# Patient Record
Sex: Female | Born: 1965 | Race: Black or African American | Hispanic: No | Marital: Married | Smoking: Never smoker
Health system: Southern US, Community
[De-identification: ages and names within clinical notes are randomized; demographics above are authoritative.]

## PROBLEM LIST (undated history)

## (undated) DIAGNOSIS — T7840XA Allergy, unspecified, initial encounter: Secondary | ICD-10-CM

## (undated) DIAGNOSIS — N83209 Unspecified ovarian cyst, unspecified side: Secondary | ICD-10-CM

## (undated) DIAGNOSIS — D649 Anemia, unspecified: Secondary | ICD-10-CM

## (undated) DIAGNOSIS — K589 Irritable bowel syndrome without diarrhea: Secondary | ICD-10-CM

## (undated) DIAGNOSIS — K219 Gastro-esophageal reflux disease without esophagitis: Secondary | ICD-10-CM

## (undated) DIAGNOSIS — L309 Dermatitis, unspecified: Secondary | ICD-10-CM

## (undated) DIAGNOSIS — J302 Other seasonal allergic rhinitis: Secondary | ICD-10-CM

## (undated) HISTORY — DX: Anemia, unspecified: D64.9

## (undated) HISTORY — DX: Allergy, unspecified, initial encounter: T78.40XA

## (undated) HISTORY — DX: Gastro-esophageal reflux disease without esophagitis: K21.9

## (undated) HISTORY — DX: Other seasonal allergic rhinitis: J30.2

## (undated) HISTORY — DX: Irritable bowel syndrome, unspecified: K58.9

---

## 2002-11-23 ENCOUNTER — Encounter: Payer: Self-pay | Admitting: Gynecology

## 2002-11-23 ENCOUNTER — Ambulatory Visit (HOSPITAL_COMMUNITY): Admission: RE | Admit: 2002-11-23 | Discharge: 2002-11-23 | Payer: Self-pay | Admitting: Gynecology

## 2003-01-26 HISTORY — PX: MYOMECTOMY ABDOMINAL APPROACH: SUR870

## 2003-02-08 ENCOUNTER — Inpatient Hospital Stay (HOSPITAL_COMMUNITY): Admission: RE | Admit: 2003-02-08 | Discharge: 2003-02-11 | Payer: Self-pay | Admitting: Gynecology

## 2003-02-08 ENCOUNTER — Encounter (INDEPENDENT_AMBULATORY_CARE_PROVIDER_SITE_OTHER): Payer: Self-pay

## 2004-08-21 ENCOUNTER — Other Ambulatory Visit: Admission: RE | Admit: 2004-08-21 | Discharge: 2004-08-21 | Payer: Self-pay | Admitting: Gynecology

## 2004-12-13 ENCOUNTER — Other Ambulatory Visit: Admission: RE | Admit: 2004-12-13 | Discharge: 2004-12-13 | Payer: Self-pay | Admitting: Gynecology

## 2005-06-30 ENCOUNTER — Encounter (INDEPENDENT_AMBULATORY_CARE_PROVIDER_SITE_OTHER): Payer: Self-pay | Admitting: *Deleted

## 2005-06-30 ENCOUNTER — Inpatient Hospital Stay (HOSPITAL_COMMUNITY): Admission: RE | Admit: 2005-06-30 | Discharge: 2005-07-03 | Payer: Self-pay | Admitting: Gynecology

## 2005-07-17 ENCOUNTER — Encounter: Admission: RE | Admit: 2005-07-17 | Discharge: 2005-08-16 | Payer: Self-pay | Admitting: Gynecology

## 2005-08-26 ENCOUNTER — Other Ambulatory Visit: Admission: RE | Admit: 2005-08-26 | Discharge: 2005-08-26 | Payer: Self-pay | Admitting: Gynecology

## 2007-03-05 ENCOUNTER — Other Ambulatory Visit: Admission: RE | Admit: 2007-03-05 | Discharge: 2007-03-05 | Payer: Self-pay | Admitting: Gynecology

## 2010-07-16 ENCOUNTER — Other Ambulatory Visit
Admission: RE | Admit: 2010-07-16 | Discharge: 2010-07-16 | Payer: Self-pay | Source: Home / Self Care | Admitting: Gynecology

## 2010-07-16 ENCOUNTER — Ambulatory Visit: Payer: Self-pay | Admitting: Gynecology

## 2010-08-18 ENCOUNTER — Encounter: Payer: Self-pay | Admitting: Gynecology

## 2010-11-21 ENCOUNTER — Emergency Department (HOSPITAL_COMMUNITY)
Admission: EM | Admit: 2010-11-21 | Discharge: 2010-11-21 | Disposition: A | Discharge status: Home / Self Care | Payer: BC Managed Care – PPO | Attending: Emergency Medicine | Admitting: Emergency Medicine

## 2010-11-21 DIAGNOSIS — R1013 Epigastric pain: Secondary | ICD-10-CM | POA: Insufficient documentation

## 2010-11-21 DIAGNOSIS — R61 Generalized hyperhidrosis: Secondary | ICD-10-CM | POA: Insufficient documentation

## 2010-11-21 DIAGNOSIS — R110 Nausea: Secondary | ICD-10-CM | POA: Insufficient documentation

## 2010-11-21 LAB — BASIC METABOLIC PANEL
BUN: 11 mg/dL (ref 6–23)
CO2: 24 mEq/L (ref 19–32)
Calcium: 9 mg/dL (ref 8.4–10.5)
Chloride: 105 mEq/L (ref 96–112)
Creatinine, Ser: 0.84 mg/dL (ref 0.4–1.2)
GFR calc Af Amer: >60 mL/min (ref >60)
GFR calc non Af Amer: >60 mL/min (ref >60)
Glucose, Bld: 100 mg/dL — ABNORMAL HIGH (ref 70–99)
Potassium: 3.8 mEq/L (ref 3.5–5.1)
Sodium: 136 mEq/L (ref 135–145)

## 2010-11-21 LAB — URINALYSIS, ROUTINE W REFLEX MICROSCOPIC
Ketones, ur: NEGATIVE mg/dL
Leukocytes, UA: NEGATIVE
Nitrite: NEGATIVE
Protein, ur: NEGATIVE mg/dL

## 2010-11-21 LAB — DIFFERENTIAL
Lymphocytes Relative: 18 % (ref 12–46)
Lymphs Abs: 1.7 10*3/uL (ref 0.7–4.0)
Neutro Abs: 7 10*3/uL (ref 1.7–7.7)
Neutrophils Relative %: 76 % (ref 43–77)

## 2010-11-21 LAB — CBC
HCT: 39.2 % (ref 36.0–46.0)
MCV: 89.5 fL (ref 78.0–100.0)
RBC: 4.38 MIL/uL (ref 3.87–5.11)
WBC: 9.2 10*3/uL (ref 4.0–10.5)

## 2010-11-21 LAB — LIPASE, BLOOD: Lipase: 30 U/L (ref 11–59)

## 2010-12-13 NOTE — Op Note (Signed)
NAME:  Beverly Turner, Beverly Turner                ACCOUNT NO.:  [id number]   MEDICAL RECORD NO.:  [id number]                   PATIENT TYPE:  INP   LOCATION:  9324                                 FACILITY:  [facility]   PHYSICIAN:  Ivor Costa. Farrel Gobble, M.D.              DATE OF BIRTH:  12-07-1965   DATE OF PROCEDURE:  02/08/2003  DATE OF DISCHARGE:                                 OPERATIVE REPORT   PREOPERATIVE DIAGNOSIS:  Symptomatic fibroid uterus.   POSTOPERATIVE DIAGNOSIS:  Symptomatic fibroid uterus.   PROCEDURE:  Multiple myomectomies and chromopertubation.   SURGEON:  Ivor Costa. Farrel Gobble, M.D.   ASSISTANT:  Gaetano Hawthorne. Lily Peer, M.D.   ANESTHESIA:  General.   FLUIDS REPLACED:  1600 mL lactated ringers.   ESTIMATED BLOOD LOSS:  Less than 100 mL.   URINE OUTPUT:  200 mL clear.   FINDINGS:  There was a large submucosal fundal fibroid, multiple subserosal  fibroids, there was free spill bilaterally from both tubes.   COMPLICATIONS:  None.   PATHOLOGY:  Fibroids, total of six.   PROCEDURE:  The patient was taken to the operating room, general anesthesia  was induced, placed in the supine position, prepped and draped in the usual  sterile fashion.  The patient's legs were frog-legged, the vagina had been  prepped.  The bivalve speculum was then placed in the vagina.  The cervix  was visualized and a #3 pediatric catheter was then placed through the  cervix and advanced towards the fundus.  The balloon was held and this was  connected to sterile tubing with indigo carmine.  The patient was then  placed in the usual supine position, draped in the usual fashion.  A  Pfannenstiel skin incision was made with the scalpel and carried through the  underlying layer of fascia with electrocautery.  The fascia was scored in  the midline and the incision extended laterally with the Mayo scissors.  The  inferior aspect of the fascial incision was grasped with Kochers, underlying  rectus muscles  were dissected off with blunt and sharp dissection.  In a  similar fashion, the superior aspect of the incision was grasped with  Kochers and the underlying rectus muscles were dissected off.  The rectus  muscles were separated in the midline.  The peritoneum was identified and  entered bluntly.  The peritoneal incision was then extended superiorly and  inferiorly with good visualization of the underlying bowel and bladder.  The  uterus was noted to be freely mobile.  What was felt to be a large posterior  fibroid, however, was palpated and felt more like a fundal fibroid.  An  O'Connor-O'Sullivan retractor was placed in the incision.  The bowel was  packed away with moist lap packs x 3.  At this point, chromopertubation was  performed and blue dye was noted at the fimbria on both tubes.  Both tubes  were noted to be lush, however, there was not a copious amount  of spill into  the cavity.  There was evidence of extravasation.   The uterus was palpated.  The fibroid was flet to be fundal.  This area was  then injected with 10 mL of a 1 in 5 Pitressin solution.  The serosa was  then scored with a scalpel and the incision was carried through.  The  fibroid was identified, stabilized with a towel clamp.  It was then bluntly  and sharply dissected off the underlying serosa and uterine muscle.  Once  the fibroid was removed, the grossly distended endometrial cavity was  evident, however, the cavity was never truly entered.  The space immediately  above this area was closed with 3-0 Vicryl in a running fashion.  However,  prior to this, the Foley catheter was removed for fear that we may have  incorporated the latex balloon into the closure.  With the incision still  opened, two small fibroids were removed from this area.  There were further  areas of fibroids that were noted.  These were unable to be retrieved safely  through this incision without extensive dissection.  Therefore, these were   individually injected with dilute Pitressin solution.  The serosa was scored  and the fibroids were removed.  All of the fibroids were noted to be  markedly superficial and none impinged on the tubes.  The myometrium on the  large incision was then reapproximated with running 0 Vicryl.  The serosa  was closed in a baseball fashion with 3-0 Vicryl.  The further incisions  that were made to remove the additional fibroids were also closed with 3-0  Vicryl in a running fashion.  The pelvis was then copiously irrigated with  warm saline.  Hemostasis of all the incisions was insured.   The uterus was then wrapped with a large sheet of Intercede.  The retractor  and lap sponges were removed carefully so as not to disturb this.  Inspection of the peritoneum and muscles showed good hemostasis.  The fascia  was closed with 0 Vicryl starting at the apex and moving towards the midline  bilaterally.  The subcutaneous tissue was irrigated and the skin was closed  with staples.  The patient tolerated the procedure well.  Sponge, lap, and  needle counts were correct x 2.  She received Ancef interoperatively.  She  was transferred to the PACU in stable condition.                                               Ivor Costa. Farrel Gobble, M.D.    THL/MEDQ  D:  02/08/2003  T:  02/08/2003  Job:  [id number]

## 2010-12-13 NOTE — Discharge Summary (Signed)
   NAME:  Beverly Turner, Beverly Turner                ACCOUNT NO.:  [id number]   MEDICAL RECORD NO.:  [id number]                   PATIENT TYPE:  INP   LOCATION:  9324                                 FACILITY:  [facility]   PHYSICIAN:  Ivor Costa. Farrel Gobble, M.D.              DATE OF BIRTH:  1965-10-04   DATE OF ADMISSION:  02/08/2003  DATE OF DISCHARGE:  02/11/2003                                 DISCHARGE SUMMARY   DISCHARGE DIAGNOSIS:  Symptomatic fibroid uterus.   PROCEDURE:  Multiple myomectomies and ____________.   HISTORY OF PRESENT ILLNESS:  A 45 year old G0 with a history of menorrhagia.  The patient states that her cycles last 10-14 days.  She has had an  ultrasound and an exam that both confirmed a fibroid uterus.  She had a  sonohystogram that showed the fibroid did not allow filling of the lower  segment.  The patient also had a question of one endometrial polyp.  She had  a histogram that confirmed the large fibroid, and it did show that she has  an intraperitoneal spill from the left tube and downspill on the right.  She  has never conceived a pregnancy.   LABORATORY DATA:  Preoperative, white count was 5.9, hemoglobin 11.5,  hematocrit 35.8, platelets 386.  Postoperative, hemoglobin was 10.2,  hematocrit 31.3.   HOSPITAL COURSE AND TREATMENT:  She was admitted to February 08, 2003 for a  myomectomy.  She did well postoperatively.  She was able to void, remained  afebrile, and she was discharged home in satisfactory condition on her third  postoperative day.   DISPOSITION:  She was discharged home with instructions to return to the  office in two weeks.  Staples were removed.  Steri-strips applied.   DISCHARGE MEDICATIONS:  She was given prescriptions for Vioxx 25 daily for  20 days, Tylox 1-2 q.4-6h. as needed.  Continue vitamins and iron daily.   She was given a [facility] discharge booklet.     Davonna Belling. Young, N.P.                      Ivor Costa. Farrel Gobble, M.D.    [facility]  D:   03/02/2003  T:  03/02/2003  Job:  [id number]

## 2010-12-13 NOTE — H&P (Signed)
Beverly Turner, DUN              ACCOUNT NO.:  [id number]   MEDICAL RECORD NO.:  [id number]          PATIENT TYPE:  INP   LOCATION:                                [facility]  [facility]   PHYSICIAN:  Ivor Costa. Farrel Gobble, M.D. DATE OF BIRTH:  05-01-1966   DATE OF ADMISSION:  06/30/2005  DATE OF DISCHARGE:  07/03/2005                                HISTORY & PHYSICAL   CHIEF COMPLAINT:  Term pregnancy with previous myomectomy.   HISTORY OF PRESENT ILLNESS:  The patient is a 45 year old G1 with an LMP of  October 01, 2004, estimated date of confinement of July 09, 2005, estimated  gestational age of 38-1/2 weeks who presents now for an elective primary  cesarean section secondary to history of multiple myomectomy.  The patient  is without complaints reporting good fetal movement, no vaginal bleeding,  and no contractions.  Her pregnancy has been complicated by a previous  multiple myomectomy for which the patient was counseled regarding the need  for primary cesarean section at term.  The patient has had no signs of labor  up until this point.  Secondly, complicated by advanced maternal age which  the patient declined antepartum testing.  The patient otherwise was without  any problems in the pregnancy.  She is O+.  Antibody negative.  RPR  nonreactive.  Rubella immune.  Hepatitis B surface antigen nonreactive.  HIV  nonreactive.  GBS negative.   PHYSICAL EXAMINATION:  GENERAL:  She is a well-appearing gravida in no acute  distress.  HEART:  Regular rate.  LUNGS:  Clear to auscultation.  ABDOMEN:  Soft, nontender without rebound or guarding.  PELVIC:  Uterus was gravid, 37 with fetal heart tones auscultated.  Vaginal  examination shows long, closed, and posterior.  EXTREMITIES:  Negative.   Of note, there is a thickened keloid scar.   ASSESSMENT:  Term pregnancy for an elective cesarean section secondary to a  multiple myomectomy done in 2004.  The risks and benefits of the procedure  were discussed with the patient.  She was given a prescription for Tylox for  postoperative pain.  She will present on the morning of the fourth for  surgery if she does not go into labor before that point.      Ivor Costa. Farrel Gobble, M.D.  Electronically Signed     THL/MEDQ  D:  06/24/2005  T:  06/24/2005  Job:  [id number]

## 2010-12-13 NOTE — Discharge Summary (Signed)
NAME:  Beverly Turner, Beverly Turner    ACCOUNT NO.:  [id number]   MEDICAL RECORD NO.:  [id number]          PATIENT TYPE:  INP   LOCATION:  9122                          [facility]ILITY[facility]  [facility]   PHYSICIAN:  Juan H. Lily Peer, M.D.DATE OF BIRTH:  Dec 12, 1965   DATE OF ADMISSION:  06/30/2005  DATE OF DISCHARGE:  07/03/2005                                 DISCHARGE SUMMARY   HISTORY:  The patient is a 45 year old gravida 1 who underwent a primary  cesarean section by Dr. Douglass Rivers secondary to the fact that the patient  had a previous multiple myomectomy. The patient was at term, delivered a  viable female infant, Apgars of 8 and 9 with a weight of 6 pounds 14 ounces.  The patient postoperatively did well. Her first postoperative day hemoglobin  was 10.1. She was O positive. She was ambulating, voiding well, her Foley  catheter had been discontinued, she was advanced to a regular diet, whereby  she was ready to be discharged on her third postoperative day. She was  afebrile. Her staples were removed on the third postoperative, incision was  Steri-Stripped, and her rubella status was there was evidence that she was  immune.   FINAL DIAGNOSES:  1.  Term intrauterine pregnancy.  2.  Previous abdominal myomectomy.  3.  Surgical anemia.   PROCEDURES PERFORMED:  Primary lower uterine segment transverse cesarean  section.   FINAL DISPOSITION AND FOLLOW-UP:  The patient was discharged home on her  third postoperative day after her staples were removed and in was Steri-  Stripped. She was instructed to continue her prenatal vitamins and iron and  she was given a prescription of Percocet to take one p.o. q.4-6h. p.r.n.  pain> She was instructed to follow up in the office in 6 weeks for a  postpartum visit.      Juan H. Lily Peer, M.D.  Electronically Signed     JHF/MEDQ  D:  08/01/2005  T:  08/01/2005  Job:  [id number]

## 2010-12-13 NOTE — H&P (Signed)
NAME:  Beverly Turner, Beverly Turner                ACCOUNT NO.:  [id number]   MEDICAL RECORD NO.:  [id number]                   PATIENT TYPE:  INP   LOCATION:  NA                                   [facility]   PHYSICIAN:  Ivor Costa. Farrel Gobble, M.D.              DATE OF BIRTH:  1966/02/07   DATE OF ADMISSION:  02/08/2003  DATE OF DISCHARGE:                                HISTORY & PHYSICAL   CHIEF COMPLAINT:  Symptomatic fibroid.   HISTORY OF PRESENT ILLNESS:  The patient is a 45 year old gravida 0, with a  history of menorrhagia. The patient states that at times her cycle will last  for 10 to 12 days of the month, although, despite the prolonged flow, she  does not seem to change a pad greater than four times a day. She had an  examination that was consistent with a fibroid with an ultrasound that was  confirmatory. Her ultrasound shows the uterus to be retroverted 10 x 6 x 7  cm with a 6.5 x 4.5 x 6 cm posterior fibroid that distorted the cavity.  A  sonohysterogram performed showed that the myoma did not allow filling of the  lower segment. The patient had a questionable endometrial polyp in the left  posterior angle that measured 8 x 6 mm.  Significance is unknown. The  patient had a hysterogram which confirmed the presence of the large fibroid  distorting the cavity.  She was noted to have intraperitoneal spill from the  left tube, however, failed to spill from the right. The patient's history is  also complicated by a history of Chlamydia back in 1991.  She and her  husband do desire children and they have been off contraception for 11  months. She has never conceived a pregnancy.   PAST OBSTETRICAL HISTORY:  As above. She has regular menses except for  menorrhagia off the birth control pill. Her Pap smears have been normal.   PAST MEDICAL HISTORY:  Negative.   PAST SURGICAL HISTORY:  Negative.   MEDICATIONS:  Prenatal vitamins.   ALLERGIES:  No known drug allergies.   SOCIAL HISTORY:  She is married.  She exercises on a regular basis.  Social  alcohol. No tobacco and no caffeine.   FAMILY HISTORY:  Noncontributory.   PHYSICAL EXAMINATION:  GENERAL: She is a well-appearing female in no acute  distress.  HEART:  Regular rate.  LUNGS:  Clear to auscultation.  ABDOMEN:  Obese, soft, and nontender.  PELVIC:  She has normal external female genitalia. The BUS is negative. Her  cervix is without gross lesions. Her uterus is retroverted felt to be  secondary to large posterior fibroid. Her adnexa are not palpable.  Rectovaginal examination was confirmatory impingement  on the rectum.    ASSESSMENT:  Symptomatic fibroid uterus. The patient will present for a  myomectomy with possible removal of the fundal fibroid.  Ivor Costa. Farrel Gobble, M.D.    THL/MEDQ  D:  02/07/2003  T:  02/07/2003  Job:  [id number]

## 2010-12-13 NOTE — Op Note (Signed)
NAME:  Beverly Turner, Beverly Turner    ACCOUNT NO.:  [id number]   MEDICAL RECORD NO.:  [id number]          PATIENT TYPE:  INP   LOCATION:  9199                          [facility]   PHYSICIAN:  Ivor Costa. Farrel Gobble, M.D. DATE OF BIRTH:  Jan 18, 1966   DATE OF PROCEDURE:  06/30/2005  DATE OF DISCHARGE:                                 OPERATIVE REPORT   PREOPERATIVE DIAGNOSES:  1.  Term pregnancy.  2.  Previous multiple myomectomy.  3.  Keloid scar.   POSTOPERATIVE DIAGNOSES:  1.  Term pregnancy.  2.  Previous multiple myomectomy.  3.  Keloid scar.  4.  Uterine fibroids and extrauterine fibroids.  5.  Pelvic scars.   OPERATION/PROCEDURE:  1.  Primary cesarean section.  2.  Partial omentectomy.  3.  Scar revision.  4.  Lysis of adhesions.  5.  Anterior peritoneal biopsy.   SURGEON:  Ivor Costa. Farrel Gobble, M.D.   ASSISTANT:  Gaetano Hawthorne. Lily Peer, M.D.   ANESTHESIA:  Spinal.   FLUIDS REPLACED:  3600 mL lactated Ringer's.   ESTIMATED BLOOD LOSS:  500 mL.   URINE OUTPUT:  150 mL clear urine.   FINDINGS:  Viable female in the vertex presentation.  Clear amniotic fluid.  Apgars 8 and 9, birth weight 6 pounds 14 ounces.  There were multiple  uterine fibroids that were appreciated.  Tubes and ovaries were normal.  There were extrauterine fibroid noted in the anterior peritoneum in a  cluster.  There were also two appreciated in the omentum.   COMPLICATIONS:  None.   PATHOLOGY:  Anterior peritoneal biopsy and portions of the omentum with and  without fibroid.   DESCRIPTION OF PROCEDURE:  The patient was taken to the operating room,  spinal anesthesia was induced, placed in the supine position with left  lateral displacement, prepped and draped in the usual sterile fashion.  After adequate anesthesia was insured, a Pfannenstiel skin incision was made  just superior to the keloid scar.  The keloid was then traced inferiorly,  elevated with an Allis clamp and sharply dissected off.  The  underlying  tissue was then entered with the Bovie.  Areas of bleeding were treated  appropriately with cautery.  The dissection was carried through until the  underlying fascia which was scored in the midline.  The fascial incision was  then extended laterally with the Bovie.  The inferior aspect of the incision  was grasped with the Kochers, elevating the underlying rectus muscles, which  were dissected, were dissected off  bluntly and sharply.  In similar  fashion, the superior aspect of incision was grasped with Kochers and  underlying rectus muscles were carefully dissected off.  The rectus muscles  were naturally separated in the midline.  The peritoneum was entered  bluntly.  Once the peritoneum was identified, the superior aspect of the  incision was then further dissected up with the examining finger on the  underside to confirm that it was free of bowel.   Attention was then turned inferiorly.  There was a moderate amount of  omentum that was scarred to the inferior aspect of the peritoneal incision.  This was crossclamped, transected and  two free ties of Vicryl were placed.  While we were doing this, we appreciated what we thought was an  extraperitoneal fibroid.  However, visualization was somewhat limited and  confirmation that this was not an ovary could not be done at this point.  We, therefore, continued to dissect down inferiorly.  The rectus muscles  were markedly separated inferiorly over the bladder.  These were also  sharply taken down with the scalpel.  Once we had adequate access to the  lower uterine segment, the bladder blade was inserted.  The vesicouterine  peritoneum was tented up and entered sharply with the Metzenbaum scissors.  The incision was extended laterally.  The bladder flap was created  digitally.  The bladder blade was then reinserted and the lower uterine  segment was incised in a transverse fashion. There was noted to be marked  uterine sinuses.   The cavity was entered bluntly.  The incision was then  extended bluntly.  The vertex was floating.  It was held with an examining  hand and Baby Elliots were used to aid in delivery.  The cord was cut and  clamped and infant was handed off to the waiting pediatricians.  Cord bloods  were obtained.  The uterus was massaged and placenta was allowed to separate  naturally after which the placenta was sent for cord blood banking.  The  uterus was then cleared of all clots and debris.  The uterine incision was  repaired with running locked layer of 0 chromic and a second suture was used  for imbrication.  The pelvis was irrigated with copious amounts of warm  saline.   At this point we were able to inspect the adnexa which were unremarkable.  There were multiple uterine fibroids that were appreciated.  Bleeding on the  bladder flap was treated where appropriate.  There was a cluster of what  appeared to be multiple small fibroids in the anterior peritoneum by the  bladder flap.  One of these was elevated and sharply dissected off.  This  was sent as a separate specimen.  The patient peritoneum was then plicated  with 2-0 Vicryl.  An area of the omentum was noted to be dusky secondary to  two suture ligatures that had been placed earlier.  This was sharply  dissected off and again a free tie was placed of 0 Vicryl.  The upper  abdomen was inspected.  The area of the omentum that had had the cluster of  fibroids as well was identified and brought into the incision.  Two clamps  were then placed around this area.  This loop of peritoneum was similarly  transected and free ties of Vicryl were placed.  Inspection of all these  pedicles ensued and hemostasis was assured.  The pelvis was then irrigated  with warm saline for assurance of hemostasis of all biopsy sites as well as  the incision.  Inspection underneath the bladder, peritoneum and muscle was assured of hemostasis and was treated where  appropriate.  The  fascia was then closed with 0 Vicryl in a running fashion.  The subcutaneous  was irrigated and treated where appropriate.  The skin was brought together  with staples.  The patient tolerated the procedure well.  Sponge, lap and  needle counts were correct x2.  She was transferred to the PACU in stable  condition.      Ivor Costa. Farrel Gobble, M.D.  Electronically Signed     THL/MEDQ  D:  06/30/2005  T:  07/01/2005  Job:  [id number]

## 2011-06-09 ENCOUNTER — Other Ambulatory Visit: Payer: Self-pay | Admitting: *Deleted

## 2011-06-09 DIAGNOSIS — Z3049 Encounter for surveillance of other contraceptives: Secondary | ICD-10-CM

## 2011-06-09 MED ORDER — LEVONORGESTREL 20 MCG/24HR IU IUD: INTRAUTERINE_SYSTEM | Freq: Once | INTRAUTERINE | Status: DC

## 2011-06-09 NOTE — Progress Notes (Signed)
Patient informed Mirena benefits.  She will have a $35 copay for remove/insert IUD.  Will have done on 07/09/11 with TF.

## 2011-07-09 ENCOUNTER — Ambulatory Visit (INDEPENDENT_AMBULATORY_CARE_PROVIDER_SITE_OTHER): Payer: BC Managed Care – PPO | Admitting: Gynecology

## 2011-07-09 ENCOUNTER — Encounter: Payer: Self-pay | Admitting: Gynecology

## 2011-07-09 VITALS — BP 120/76

## 2011-07-09 DIAGNOSIS — Z3049 Encounter for surveillance of other contraceptives: Secondary | ICD-10-CM

## 2011-07-09 DIAGNOSIS — Z30431 Encounter for routine checking of intrauterine contraceptive device: Secondary | ICD-10-CM

## 2011-07-09 HISTORY — PX: INTRAUTERINE DEVICE INSERTION: SHX323

## 2011-07-09 NOTE — Progress Notes (Signed)
Patient presents for Mirena IUD removal and replacement. She is 5 years from her placement to the day. She is read through the consent booklet and signed the consent form. I counseled her again for the Mirena IUD to include alternatives, insertional process, the acute risks of infection and uterine perforation requiring surgery to remove. The long-term risks of infection and failure risk with pregnancy also reviewed. Patient's questions are answered.  Exam with chaperone present Pelvic external BUS vagina normal cervix normal IUD string visualized. Uterus retroverted normal size midline mobile nontender. Adnexa without masses or tenderness.  IUD insertion The IUD string was grasped with a [place] forcep and the Mirena IUD was removed shown to the patient and discarded. Her cervix was cleansed with Betadine, single tooth tenaculum anterior lip stabilization, uterus was sounded and a new Mirena was placed according to manufacturer's recommendations. The strings were trimmed. The patient tolerated well  And she is going to follow up next week when she is actually scheduled for an annual exam for postinsertional check.

## 2011-07-09 NOTE — Patient Instructions (Signed)
Report any fevers, increasing cramping, heavy bleeding. Follow up for IUD postinsertional exam.

## 2011-07-18 ENCOUNTER — Encounter: Payer: Self-pay | Admitting: Gynecology

## 2011-07-18 ENCOUNTER — Ambulatory Visit (INDEPENDENT_AMBULATORY_CARE_PROVIDER_SITE_OTHER): Payer: BC Managed Care – PPO | Admitting: Gynecology

## 2011-07-18 VITALS — BP 122/72 | Ht 64.12 in | Wt 192.0 lb

## 2011-07-18 DIAGNOSIS — Z13220 Encounter for screening for lipoid disorders: Secondary | ICD-10-CM

## 2011-07-18 DIAGNOSIS — Z01419 Encounter for gynecological examination (general) (routine) without abnormal findings: Secondary | ICD-10-CM

## 2011-07-18 DIAGNOSIS — Z131 Encounter for screening for diabetes mellitus: Secondary | ICD-10-CM

## 2011-07-18 NOTE — Patient Instructions (Signed)
Arrange mammography. Follow up in one year, sooner as needed

## 2011-07-18 NOTE — Progress Notes (Signed)
Beverly Turner 06-Dec-1965 [id number]        45 y.o.  for annual exam.  Doing well no complaints recently had IUD placed.  Past medical history,surgical history, medications, allergies, family history and social history were all reviewed and documented in the EPIC chart. ROS:  Was performed and pertinent positives and negatives are included in the history.  Exam: chaperone present Filed Vitals:   07/18/11 1008  BP: 122/72   General appearance  Normal Skin grossly normal Head/Neck normal with no cervical or supraclavicular adenopathy thyroid normal Lungs  clear Cardiac RR, without RMG Abdominal  soft, nontender, without masses, organomegaly or hernia Breasts  examined lying and sitting without masses, retractions, discharge or axillary adenopathy. Pelvic  Ext/BUS/vagina  normal   Cervix  normal  IUD string visualized and trimmed.  Uterus  anteverted, normal size, shape and contour, midline and mobile nontender   Adnexa  Without masses or tenderness    Anus and perineum  normal   Rectovaginal  normal sphincter tone without palpated masses or tenderness.    Assessment/Plan:  45 y.o. female for annual exam.    1. IUD management. IUD string visualized and a little on the longer side.  I went ahead and trimmed it. She's otherwise doing well without complaints doing no bleeding. 2. Pap smear. No Pap smear was done today as she has no history of abnormal Pap smears with serial normal records in her chart. Her last Pap smear was 2011 and we'll plan an every 3 year screen per current guidelines. 3. Breast health. SBE monthly reviewed. She has not had a mammogram in several years I strongly urged her to schedule this and she agrees to arrange this. 4. Health maintenance. Will check baseline CBC, glucose, lipid profile, urinalysis. She will see me in a year assuming she continues well, sooner as needed.    Dara Lords MD, 10:25 AM 07/18/2011

## 2011-07-19 LAB — GLUCOSE, RANDOM: Glucose, Bld: 75 mg/dL (ref 70–99)

## 2011-08-04 ENCOUNTER — Encounter: Payer: Self-pay | Admitting: Gynecology

## 2012-09-28 ENCOUNTER — Encounter: Payer: BC Managed Care – PPO | Admitting: Gynecology

## 2012-11-10 ENCOUNTER — Ambulatory Visit (INDEPENDENT_AMBULATORY_CARE_PROVIDER_SITE_OTHER): Payer: BC Managed Care – PPO | Admitting: Gynecology

## 2012-11-10 ENCOUNTER — Encounter: Payer: Self-pay | Admitting: Gynecology

## 2012-11-10 DIAGNOSIS — N926 Irregular menstruation, unspecified: Secondary | ICD-10-CM

## 2012-11-10 DIAGNOSIS — T8389XA Other specified complication of genitourinary prosthetic devices, implants and grafts, initial encounter: Secondary | ICD-10-CM

## 2012-11-10 LAB — CBC WITH DIFFERENTIAL/PLATELET
Basophils Relative: 1 % (ref 0–1)
HCT: 34.5 % — ABNORMAL LOW (ref 36.0–46.0)
Hemoglobin: 11.4 g/dL — ABNORMAL LOW (ref 12.0–15.0)
MCH: 29.2 pg (ref 26.0–34.0)
MCHC: 33 g/dL (ref 30.0–36.0)
Monocytes Absolute: 0.4 10*3/uL (ref 0.1–1.0)
Monocytes Relative: 6 % (ref 3–12)
Neutro Abs: 4.1 10*3/uL (ref 1.7–7.7)

## 2012-11-10 LAB — HCG, SERUM, QUALITATIVE: Preg, Serum: NEGATIVE

## 2012-11-10 NOTE — Patient Instructions (Addendum)
Follow up for ultrasound as scheduled Schedule annual exam after we've resolved the IUD bleeding issue.

## 2012-11-10 NOTE — Progress Notes (Signed)
Patient presents having not been seen for over a year complaining of persistent bleeding of the last 2 weeks with episodes of heavy bleeding and passage of clots.  Patient has a Mirena IUD that was placed 06/2011. She had a followup exam that showed the IUD string at the cervix. She also has a history of leiomyoma status post abdominal multiple myomectomy. No pain with the bleeding or other symptoms. She was having scant to absent menses with the IUD in place until this most recent irregular bleeding.   Exam with Kim assistant Abdomen soft nontender without masses guarding rebound or organomegaly. Pelvic external BUS vagina with slight menses flow. Cervix normal without IUD string visualized. Uterus retroverted grossly normal in size midline mobile nontender. Adnexa without masses or tenderness.  Assessment and plan: Irregular bleeding history of Mirena IUD, string not visualized. We'll start with CBC for baseline hemoglobin getting heavy bleeding history. TSH and hCG. Ultrasound for IUD location and endometrial assessment. Patient will followup for these studies and we'll go from there. She is overdue for her annual exam she also knows to schedule this.

## 2012-11-29 ENCOUNTER — Ambulatory Visit (INDEPENDENT_AMBULATORY_CARE_PROVIDER_SITE_OTHER): Payer: BC Managed Care – PPO

## 2012-11-29 ENCOUNTER — Encounter: Payer: Self-pay | Admitting: Gynecology

## 2012-11-29 ENCOUNTER — Other Ambulatory Visit: Payer: Self-pay | Admitting: Gynecology

## 2012-11-29 ENCOUNTER — Ambulatory Visit (INDEPENDENT_AMBULATORY_CARE_PROVIDER_SITE_OTHER): Payer: BC Managed Care – PPO | Admitting: Gynecology

## 2012-11-29 DIAGNOSIS — R188 Other ascites: Secondary | ICD-10-CM

## 2012-11-29 DIAGNOSIS — D259 Leiomyoma of uterus, unspecified: Secondary | ICD-10-CM

## 2012-11-29 DIAGNOSIS — N923 Ovulation bleeding: Secondary | ICD-10-CM

## 2012-11-29 DIAGNOSIS — D3910 Neoplasm of uncertain behavior of unspecified ovary: Secondary | ICD-10-CM

## 2012-11-29 DIAGNOSIS — Z30431 Encounter for routine checking of intrauterine contraceptive device: Secondary | ICD-10-CM

## 2012-11-29 DIAGNOSIS — N854 Malposition of uterus: Secondary | ICD-10-CM

## 2012-11-29 DIAGNOSIS — D251 Intramural leiomyoma of uterus: Secondary | ICD-10-CM

## 2012-11-29 DIAGNOSIS — D3911 Neoplasm of uncertain behavior of right ovary: Secondary | ICD-10-CM

## 2012-11-29 DIAGNOSIS — N926 Irregular menstruation, unspecified: Secondary | ICD-10-CM

## 2012-11-29 DIAGNOSIS — N8300 Follicular cyst of ovary, unspecified side: Secondary | ICD-10-CM

## 2012-11-29 NOTE — Patient Instructions (Signed)
Schedule annual exam for now.

## 2012-11-29 NOTE — Progress Notes (Signed)
Patient follows up for ultrasound. Several week history of irregular heavy bleeding. Mirena IUD in place although the string was not visualized. Patient notes that her bleeding has subsided.  Ultrasound shows IUD within the endometrial cavity. Multiple small myomas largest measuring 33 mm. Uterus is retroverted. Right left ovaries visualized with cystic changes.  Assessment and plan: History of irregular bleeding now resolved. Multiple small myomas. History of abdominal myomectomy 2004. Bilateral adnexal cystic changes probable physiologic. Recommend annual exam now she is overdue and then re\re ultrasound with menstrual calendar in the interim in 2 months to relook at her adnexa and then go from there. Patient agrees with the plan.

## 2014-05-29 ENCOUNTER — Encounter: Payer: Self-pay | Admitting: Gynecology

## 2015-06-28 ENCOUNTER — Other Ambulatory Visit (HOSPITAL_COMMUNITY)
Admission: RE | Admit: 2015-06-28 | Discharge: 2015-06-28 | Disposition: A | Discharge status: Home / Self Care | Payer: BC Managed Care – PPO | Source: Ambulatory Visit | Attending: Women's Health | Admitting: Women's Health

## 2015-06-28 ENCOUNTER — Ambulatory Visit (INDEPENDENT_AMBULATORY_CARE_PROVIDER_SITE_OTHER): Payer: BC Managed Care – PPO | Admitting: Women's Health

## 2015-06-28 ENCOUNTER — Encounter: Payer: Self-pay | Admitting: Women's Health

## 2015-06-28 VITALS — BP 122/82 | Ht 64.0 in | Wt 187.0 lb

## 2015-06-28 DIAGNOSIS — Z01419 Encounter for gynecological examination (general) (routine) without abnormal findings: Secondary | ICD-10-CM | POA: Diagnosis not present

## 2015-06-28 DIAGNOSIS — N921 Excessive and frequent menstruation with irregular cycle: Secondary | ICD-10-CM | POA: Diagnosis not present

## 2015-06-28 DIAGNOSIS — Z1329 Encounter for screening for other suspected endocrine disorder: Secondary | ICD-10-CM

## 2015-06-28 DIAGNOSIS — Z1151 Encounter for screening for human papillomavirus (HPV): Secondary | ICD-10-CM | POA: Diagnosis present

## 2015-06-28 LAB — TSH: TSH: 1.788 u[IU]/mL (ref 0.350–4.500)

## 2015-06-28 NOTE — Progress Notes (Addendum)
Beverly Turner 09/09/1965 [id number]    History:    Presents for annual exam.  Cycles every 2-3 months which she relates to IBS type symptoms. Reports cycles extremely heavy passing clots and changing pads/tampons every 45 minutes. Mirena IUD placed 05/2011. 2004 multiple myomectomy conceived shortly after. Normal Pap and mammogram history. Reports anemia "hemoglobin 5"  at primary care annual exam taking iron supplement. 2012 [place]  confirmed IUD placement (strings not visualized)  6 fibroids 1.5- 3cm.  Past medical history, past surgical history, family history and social history were all reviewed and documented in the EPIC chart. 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 son is 61 years old, doing well.  Parents hypertension.  ROS:  A ROS was performed and pertinent positives and negatives are included.  Exam:  Filed Vitals:   06/28/15 0907  BP: 122/82    General appearance:  Normal Thyroid:  Symmetrical, normal in size, without palpable masses or nodularity. Respiratory  Auscultation:  Clear without wheezing or rhonchi Cardiovascular  Auscultation:  Regular rate, without rubs, murmurs or gallops  Edema/varicosities:  Not grossly evident Abdominal  Soft,nontender, without masses, guarding or rebound.  Liver/spleen:  No organomegaly noted  Hernia:  None appreciated  Skin  Inspection:  Grossly normal   Breasts: Examined lying and sitting.     Right: Without masses, retractions, discharge or axillary adenopathy.     Left: Without masses, retractions, discharge or axillary adenopathy. Gentitourinary   Inguinal/mons:  Normal without inguinal adenopathy  External genitalia:  Normal  BUS/Urethra/Skene's glands:  Normal  Vagina:  Normal  Cervix:  Normal IUD string not visible  Uterus:  Bulky 8 week size.  Midline and mobile  Adnexa/parametria:     Rt: Without masses or tenderness.   Lt: Without masses or tenderness.  Anus and perineum: Normal  Digital rectal  exam: Normal sphincter tone without palpated masses or tenderness  Assessment/Plan:  49 y.o. MBF G1 P1 for annual exam irregular cycles with menorrhagia.  Anemia Fibroid uterus 05/2011 Mirena IUD-Irregular cycles/every 2-3 months menorrhagia relates to IBS IBS-Prilosec per primary care labs and meds  Plan: Ultrasound, will schedule. SBE's, encouraged 3-D tomography history of dense breasts, instructed to have reports sent to office. Had done at [facility]. Encouraged to continue increasing regular exercise, decreasing calories for weight loss. Calcium and iron rich diet, vitamin D 1000 daily encouraged. CBC, TSH, UA, Pap with HR HPV typing, new screening guidelines reviewed.    [place], 10:30 AM 06/28/2015

## 2015-06-28 NOTE — Patient Instructions (Signed)
Diet for Irritable Bowel Syndrome When you have irritable bowel syndrome (IBS), the foods you eat and your eating habits are very important. IBS may cause various symptoms, such as abdominal pain, constipation, or diarrhea. Choosing the right foods can help ease discomfort caused by these symptoms. Work with your health care provider and dietitian to find the best eating plan to help control your symptoms. WHAT GENERAL GUIDELINES DO I NEED TO FOLLOW?  Keep a food diary. This will help you identify foods that cause symptoms. Write down:  What you eat and when.  What symptoms you have.  When symptoms occur in relation to your meals.  Avoid foods that cause symptoms. Talk with your dietitian about other ways to get the same nutrients that are in these foods.  Eat more foods that contain fiber. Take a fiber supplement if directed by your dietitian.  Eat your meals slowly, in a relaxed setting.  Aim to eat 5-6 small meals per day. Do not skip meals.  Drink enough fluids to keep your urine clear or pale yellow.  Ask your health care provider if you should take an over-the-counter probiotic during flare-ups to help restore healthy gut bacteria.  If you have cramping or diarrhea, try making your meals low in fat and high in carbohydrates. Examples of carbohydrates are pasta, rice, whole grain breads and cereals, fruits, and vegetables.  If dairy products cause your symptoms to flare up, try eating less of them. You might be able to handle yogurt better than other dairy products because it contains bacteria that help with digestion. WHAT FOODS ARE NOT RECOMMENDED? The following are some foods and drinks that may worsen your symptoms:  Fatty foods, such as [place] fries.  Milk products, such as cheese or ice cream.  Chocolate.  Alcohol.  Products with caffeine, such as coffee.  Carbonated drinks, such as soda. The items listed above may not be a complete list of foods and beverages to  avoid. Contact your dietitian for more information. WHAT FOODS ARE GOOD SOURCES OF FIBER? Your health care provider or dietitian may recommend that you eat more foods that contain fiber. Fiber can help reduce constipation and other IBS symptoms. Add foods with fiber to your diet a little at a time so that your body can get used to them. Too much fiber at once might cause gas and swelling of your abdomen. The following are some foods that are good sources of fiber:  Apples.  Peaches.  Pears.  Berries.  Figs.  Broccoli (raw).  Cabbage.  Carrots.  Raw peas.  Kidney beans.  Lima beans.  Whole grain bread.  Whole grain cereal. FOR MORE INFORMATION  International Foundation for Functional Gastrointestinal Disorders: www.iffgd.[employer] Diabetes and Digestive and Kidney Diseases: NetworkAffair.co.za.aspx   This information is not intended to replace advice given to you by your health care provider. Make sure you discuss any questions you have with your health care provider.   Document Released: 10/04/2003 Document Revised: 08/04/2014 Document Reviewed: 10/14/2013 Elsevier Interactive Patient Education 2016 Elsevier Inc[employer] Menorrhagia Menorrhagia is the medical term for when your menstrual periods are heavy or last longer than usual. With menorrhagia, every period you have may cause enough blood loss and cramping that you are unable to maintain your usual activities. CAUSES  In some cases, the cause of heavy periods is unknown, but a number of conditions may cause menorrhagia. Common causes include:  A problem with the hormone-producing thyroid gland (hypothyroid).  Noncancerous growths  in the uterus (polyps or fibroids).  An imbalance of the estrogen and progesterone hormones.  One of your ovaries not releasing an egg during one or more months.  Side effects of having an intrauterine device  (IUD).  Side effects of some medicines, such as anti-inflammatory medicines or blood thinners.  A bleeding disorder that stops your blood from clotting normally. SIGNS AND SYMPTOMS  During a normal period, bleeding lasts between 4 and 8 days. Signs that your periods are too heavy include:  You routinely have to change your pad or tampon every 1 or 2 hours because it is completely soaked.  You pass blood clots larger than 1 inch (2.5 cm) in size.  You have bleeding for more than 7 days.  You need to use pads and tampons at the same time because of heavy bleeding.  You need to wake up to change your pads or tampons during the night.  You have symptoms of anemia, such as tiredness, fatigue, or shortness of breath. DIAGNOSIS  Your health care provider will perform a physical exam and ask you questions about your symptoms and menstrual history. Other tests may be ordered based on what the health care provider finds during the exam. These tests can include:  Blood tests. Blood tests are used to check if you are pregnant or have hormonal changes, a bleeding or thyroid disorder, low iron levels (anemia), or other problems.  Endometrial biopsy. Your health care provider takes a sample of tissue from the inside of your uterus to be examined under a microscope.  Pelvic ultrasound. This test uses sound waves to make a picture of your uterus, ovaries, and vagina. The pictures can show if you have fibroids or other growths.  Hysteroscopy. For this test, your health care provider will use a small telescope to look inside your uterus. Based on the results of your initial tests, your health care provider may recommend further testing. TREATMENT  Treatment may not be needed. If it is needed, your health care provider may recommend treatment with one or more medicines first. If these do not reduce bleeding enough, a surgical treatment might be an option. The best treatment for you will depend on:    Whether you need to prevent pregnancy.  Your desire to have children in the future.  The cause and severity of your bleeding.  Your opinion and personal preference.  Medicines for menorrhagia may include:  Birth control methods that use hormones. These include the pill, skin patch, vaginal ring, shots that you get every 3 months, hormonal IUD, and implant. These treatments reduce bleeding during your menstrual period.  Medicines that thicken blood and slow bleeding.  Medicines that reduce swelling, such as ibuprofen.  Medicines that contain a synthetic hormone called progestin.   Medicines that make the ovaries stop working for a short time.  You may need surgical treatment for menorrhagia if the medicines are unsuccessful. Treatment options include:  Dilation and curettage (D&C). In this procedure, your health care provider opens (dilates) your cervix and then scrapes or suctions tissue from the lining of your uterus to reduce menstrual bleeding.  Operative hysteroscopy. This procedure uses a tiny tube with a light (hysteroscope) to view your uterine cavity and can help in the surgical removal of a polyp that may be causing heavy periods.  Endometrial ablation. Through various techniques, your health care provider permanently destroys the entire lining of your uterus (endometrium). After endometrial ablation, most women have little or no menstrual flow. Endometrial  ablation reduces your ability to become pregnant.  Endometrial resection. This surgical procedure uses an electrosurgical wire loop to remove the lining of the uterus. This procedure also reduces your ability to become pregnant.  Hysterectomy. Surgical removal of the uterus and cervix is a permanent procedure that stops menstrual periods. Pregnancy is not possible after a hysterectomy. This procedure requires anesthesia and hospitalization. HOME CARE INSTRUCTIONS   Only take over-the-counter or prescription  medicines as directed by your health care provider. Take prescribed medicines exactly as directed. Do not change or switch medicines without consulting your health care provider.  Take any prescribed iron pills exactly as directed by your health care provider. Long-term heavy bleeding may result in low iron levels. Iron pills help replace the iron your body lost from heavy bleeding. Iron may cause constipation. If this becomes a problem, increase the bran, fruits, and roughage in your diet.  Do not take aspirin or medicines that contain aspirin 1 week before or during your menstrual period. Aspirin may make the bleeding worse.  If you need to change your sanitary pad or tampon more than once every 2 hours, stay in bed and rest as much as possible until the bleeding stops.  Eat well-balanced meals. Eat foods high in iron. Examples are leafy green vegetables, meat, liver, eggs, and whole grain breads and cereals. Do not try to lose weight until the abnormal bleeding has stopped and your blood iron level is back to normal. SEEK MEDICAL CARE IF:   You soak through a pad or tampon every 1 or 2 hours, and this happens every time you have a period.  You need to use pads and tampons at the same time because you are bleeding so much.  You need to change your pad or tampon during the night.  You have a period that lasts for more than 8 days.  You pass clots bigger than 1 inch wide.  You have irregular periods that happen more or less often than once a month.  You feel dizzy or faint.  You feel very weak or tired.  You feel short of breath or feel your heart is beating too fast when you exercise.  You have nausea and vomiting or diarrhea while you are taking your medicine.  You have any problems that may be related to the medicine you are taking. SEEK IMMEDIATE MEDICAL CARE IF:   You soak through 4 or more pads or tampons in 2 hours.  You have any bleeding while you are pregnant. MAKE SURE  YOU:   Understand these instructions.  Will watch your condition.  Will get help right away if you are not doing well or get worse.   This information is not intended to replace advice given to you by your health care provider. Make sure you discuss any questions you have with your health care provider.   Document Released: 07/14/2005 Document Revised: 07/19/2013 Document Reviewed: 01/02/2013 Elsevier Interactive Patient Education 2016 [place] Maintenance, Female Adopting a healthy lifestyle and getting preventive care can go a long way to promote health and wellness. Talk with your health care provider about what schedule of regular examinations is right for you. This is a good chance for you to check in with your provider about disease prevention and staying healthy. In between checkups, there are plenty of things you can do on your own. Experts have done a lot of research about which lifestyle changes and preventive measures are most likely to keep you  healthy. Ask your health care provider for more information. WEIGHT AND DIET  Eat a healthy diet  Be sure to include plenty of vegetables, fruits, low-fat dairy products, and lean protein.  Do not eat a lot of foods high in solid fats, added sugars, or salt.  Get regular exercise. This is one of the most important things you can do for your health.  Most adults should exercise for at least 150 minutes each week. The exercise should increase your heart rate and make you sweat (moderate-intensity exercise).  Most adults should also do strengthening exercises at least twice a week. This is in addition to the moderate-intensity exercise.  Maintain a healthy weight  Body mass index (BMI) is a measurement that can be used to identify possible weight problems. It estimates body fat based on height and weight. Your health care provider can help determine your BMI and help you achieve or maintain a healthy weight.  For females  11 years of age and older:   A BMI below 18.5 is considered underweight.  A BMI of 18.5 to 24.9 is normal.  A BMI of 25 to 29.9 is considered overweight.  A BMI of 30 and above is considered obese.  Watch levels of cholesterol and blood lipids  You should start having your blood tested for lipids and cholesterol at 47 years of age, then have this test every 5 years.  You may need to have your cholesterol levels checked more often if:  Your lipid or cholesterol levels are high.  You are older than 47 years of age.  You are at high risk for heart disease.  CANCER SCREENING   Lung Cancer  Lung cancer screening is recommended for adults 2-14 years old who are at high risk for lung cancer because of a history of smoking.  A yearly low-dose CT scan of the lungs is recommended for people who:  Currently smoke.  Have quit within the past 15 years.  Have at least a 30-pack-year history of smoking. A pack year is smoking an average of one pack of cigarettes a day for 1 year.  Yearly screening should continue until it has been 15 years since you quit.  Yearly screening should stop if you develop a health problem that would prevent you from having lung cancer treatment.  Breast Cancer  Practice breast self-awareness. This means understanding how your breasts normally appear and feel.  It also means doing regular breast self-exams. Let your health care provider know about any changes, no matter how small.  If you are in your 20s or 30s, you should have a clinical breast exam (CBE) by a health care provider every 1-3 years as part of a regular health exam.  If you are 22 or older, have a CBE every year. Also consider having a breast X-ray (mammogram) every year.  If you have a family history of breast cancer, talk to your health care provider about genetic screening.  If you are at high risk for breast cancer, talk to your health care provider about having an MRI and a  mammogram every year.  Breast cancer gene (BRCA) assessment is recommended for women who have family members with BRCA-related cancers. BRCA-related cancers include:  Breast.  Ovarian.  Tubal.  Peritoneal cancers.  Results of the assessment will determine the need for genetic counseling and BRCA1 and BRCA2 testing. Cervical Cancer Your health care provider may recommend that you be screened regularly for cancer of the pelvic organs (ovaries, uterus,  and vagina). This screening involves a pelvic examination, including checking for microscopic changes to the surface of your cervix (Pap test). You may be encouraged to have this screening done every 3 years, beginning at age 20.  For women ages 78-65, health care providers may recommend pelvic exams and Pap testing every 3 years, or they may recommend the Pap and pelvic exam, combined with testing for human papilloma virus (HPV), every 5 years. Some types of HPV increase your risk of cervical cancer. Testing for HPV may also be done on women of any age with unclear Pap test results.  Other health care providers may not recommend any screening for nonpregnant women who are considered low risk for pelvic cancer and who do not have symptoms. Ask your health care provider if a screening pelvic exam is right for you.  If you have had past treatment for cervical cancer or a condition that could lead to cancer, you need Pap tests and screening for cancer for at least 20 years after your treatment. If Pap tests have been discontinued, your risk factors (such as having a new sexual partner) need to be reassessed to determine if screening should resume. Some women have medical problems that increase the chance of getting cervical cancer. In these cases, your health care provider may recommend more frequent screening and Pap tests. Colorectal Cancer  This type of cancer can be detected and often prevented.  Routine colorectal cancer screening usually  begins at 47 years of age and continues through 47 years of age.  Your health care provider may recommend screening at an earlier age if you have risk factors for colon cancer.  Your health care provider may also recommend using home test kits to check for hidden blood in the stool.  A small camera at the end of a tube can be used to examine your colon directly (sigmoidoscopy or colonoscopy). This is done to check for the earliest forms of colorectal cancer.  Routine screening usually begins at age 37.  Direct examination of the colon should be repeated every 5-10 years through 47 years of age. However, you may need to be screened more often if early forms of precancerous polyps or small growths are found. Skin Cancer  Check your skin from head to toe regularly.  Tell your health care provider about any new moles or changes in moles, especially if there is a change in a mole's shape or color.  Also tell your health care provider if you have a mole that is larger than the size of a pencil eraser.  Always use sunscreen. Apply sunscreen liberally and repeatedly throughout the day.  Protect yourself by wearing long sleeves, pants, a wide-brimmed hat, and sunglasses whenever you are outside. HEART DISEASE, DIABETES, AND HIGH BLOOD PRESSURE   High blood pressure causes heart disease and increases the risk of stroke. High blood pressure is more likely to develop in:  People who have blood pressure in the high end of the normal range (130-139/85-89 mm Hg).  People who are overweight or obese.  People who are African American.  If you are 76-73 years of age, have your blood pressure checked every 3-5 years. If you are 73 years of age or older, have your blood pressure checked every year. You should have your blood pressure measured twice--once when you are at a hospital or clinic, and once when you are not at a hospital or clinic. Record the average of the two measurements. To check your blood  pressure when you are not at a hospital or clinic, you can use:  An automated blood pressure machine at a pharmacy.  A home blood pressure monitor.  If you are between 72 years and 41 years old, ask your health care provider if you should take aspirin to prevent strokes.  Have regular diabetes screenings. This involves taking a blood sample to check your fasting blood sugar level.  If you are at a normal weight and have a low risk for diabetes, have this test once every three years after 47 years of age.  If you are overweight and have a high risk for diabetes, consider being tested at a younger age or more often. PREVENTING INFECTION  Hepatitis B  If you have a higher risk for hepatitis B, you should be screened for this virus. You are considered at high risk for hepatitis B if:  You were born in a country where hepatitis B is common. Ask your health care provider which countries are considered high risk.  Your parents were born in a high-risk country, and you have not been immunized against hepatitis B (hepatitis B vaccine).  You have HIV or AIDS.  You use needles to inject street drugs.  You live with someone who has hepatitis B.  You have had sex with someone who has hepatitis B.  You get hemodialysis treatment.  You take certain medicines for conditions, including cancer, organ transplantation, and autoimmune conditions. Hepatitis C  Blood testing is recommended for:  Everyone born from [birth year] through 1963.  Anyone with known risk factors for hepatitis C. Sexually transmitted infections (STIs)  You should be screened for sexually transmitted infections (STIs) including gonorrhea and chlamydia if:  You are sexually active and are younger than 47 years of age.  You are older than 47 years of age and your health care provider tells you that you are at risk for this type of infection.  Your sexual activity has changed since you were last screened and you are at an  increased risk for chlamydia or gonorrhea. Ask your health care provider if you are at risk.  If you do not have HIV, but are at risk, it may be recommended that you take a prescription medicine daily to prevent HIV infection. This is called pre-exposure prophylaxis (PrEP). You are considered at risk if:  You are sexually active and do not regularly use condoms or know the HIV status of your partner(s).  You take drugs by injection.  You are sexually active with a partner who has HIV. Talk with your health care provider about whether you are at high risk of being infected with HIV. If you choose to begin PrEP, you should first be tested for HIV. You should then be tested every 3 months for as long as you are taking PrEP.  PREGNANCY   If you are premenopausal and you may become pregnant, ask your health care provider about preconception counseling.  If you may become pregnant, take 400 to 800 micrograms (mcg) of folic acid every day.  If you want to prevent pregnancy, talk to your health care provider about birth control (contraception). OSTEOPOROSIS AND MENOPAUSE   Osteoporosis is a disease in which the bones lose minerals and strength with aging. This can result in serious bone fractures. Your risk for osteoporosis can be identified using a bone density scan.  If you are 7 years of age or older, or if you are at risk for osteoporosis and fractures, ask your health  care provider if you should be screened.  Ask your health care provider whether you should take a calcium or vitamin D supplement to lower your risk for osteoporosis.  Menopause may have certain physical symptoms and risks.  Hormone replacement therapy may reduce some of these symptoms and risks. Talk to your health care provider about whether hormone replacement therapy is right for you.  HOME CARE INSTRUCTIONS   Schedule regular health, dental, and eye exams.  Stay current with your immunizations.   Do not use any  tobacco products including cigarettes, chewing tobacco, or electronic cigarettes.  If you are pregnant, do not drink alcohol.  If you are breastfeeding, limit how much and how often you drink alcohol.  Limit alcohol intake to no more than 1 drink per day for nonpregnant women. One drink equals 12 ounces of beer, 5 ounces of wine, or 1 ounces of hard liquor.  Do not use street drugs.  Do not share needles.  Ask your health care provider for help if you need support or information about quitting drugs.  Tell your health care provider if you often feel depressed.  Tell your health care provider if you have ever been abused or do not feel safe at home.   This information is not intended to replace advice given to you by your health care provider. Make sure you discuss any questions you have with your health care provider.   Document Released: 01/27/2011 Document Revised: 08/04/2014 Document Reviewed: 06/15/2013 Elsevier Interactive Patient Education [employer].

## 2015-06-29 ENCOUNTER — Other Ambulatory Visit: Payer: Self-pay | Admitting: Women's Health

## 2015-06-29 ENCOUNTER — Telehealth: Payer: Self-pay | Admitting: Gynecology

## 2015-06-29 DIAGNOSIS — D500 Iron deficiency anemia secondary to blood loss (chronic): Secondary | ICD-10-CM

## 2015-06-29 DIAGNOSIS — N920 Excessive and frequent menstruation with regular cycle: Secondary | ICD-10-CM

## 2015-06-29 LAB — CBC WITH DIFFERENTIAL/PLATELET
BASOS ABS: 0 10*3/uL (ref 0.0–0.1)
BASOS PCT: 0 % (ref 0–1)
EOS ABS: 0.1 10*3/uL (ref 0.0–0.7)
EOS PCT: 2 % (ref 0–5)
HCT: 23 % — ABNORMAL LOW (ref 36.0–46.0)
Hemoglobin: 6.1 g/dL — CL (ref 12.0–15.0)
LYMPHS PCT: 20 % (ref 12–46)
Lymphs Abs: 0.9 10*3/uL (ref 0.7–4.0)
MCH: 17.3 pg — AB (ref 26.0–34.0)
MCHC: 26.5 g/dL — ABNORMAL LOW (ref 30.0–36.0)
MCV: 65.2 fL — ABNORMAL LOW (ref 78.0–100.0)
MPV: 8.5 fL — AB (ref 8.6–12.4)
Monocytes Absolute: 0.3 10*3/uL (ref 0.1–1.0)
Monocytes Relative: 7 % (ref 3–12)
Neutro Abs: 3.2 10*3/uL (ref 1.7–7.7)
Neutrophils Relative %: 71 % (ref 43–77)
PLATELETS: 735 10*3/uL — AB (ref 150–400)
RBC: 3.53 MIL/uL — AB (ref 3.87–5.11)
RDW: 23.6 % — ABNORMAL HIGH (ref 11.5–15.5)
WBC: 4.5 10*3/uL (ref 4.0–10.5)

## 2015-06-29 LAB — CYTOLOGY - PAP

## 2015-06-29 NOTE — Telephone Encounter (Signed)
06/29/15-I LM VM cell for pt that her State [employer] ins will cover her sonohysterogram under her $39 copay. Per Snow@BC --[id number]

## 2015-07-02 ENCOUNTER — Other Ambulatory Visit: Payer: Self-pay | Admitting: Gynecology

## 2015-07-02 DIAGNOSIS — N920 Excessive and frequent menstruation with regular cycle: Secondary | ICD-10-CM

## 2015-07-02 DIAGNOSIS — D500 Iron deficiency anemia secondary to blood loss (chronic): Secondary | ICD-10-CM

## 2015-07-02 DIAGNOSIS — N939 Abnormal uterine and vaginal bleeding, unspecified: Secondary | ICD-10-CM

## 2015-07-06 ENCOUNTER — Telehealth: Payer: Self-pay | Admitting: *Deleted

## 2015-07-06 ENCOUNTER — Ambulatory Visit (INDEPENDENT_AMBULATORY_CARE_PROVIDER_SITE_OTHER): Payer: BC Managed Care – PPO

## 2015-07-06 ENCOUNTER — Other Ambulatory Visit: Payer: Self-pay | Admitting: Gynecology

## 2015-07-06 ENCOUNTER — Encounter: Payer: Self-pay | Admitting: Gynecology

## 2015-07-06 ENCOUNTER — Ambulatory Visit: Payer: BC Managed Care – PPO | Admitting: Women's Health

## 2015-07-06 ENCOUNTER — Other Ambulatory Visit: Payer: BC Managed Care – PPO

## 2015-07-06 ENCOUNTER — Ambulatory Visit (INDEPENDENT_AMBULATORY_CARE_PROVIDER_SITE_OTHER): Payer: BC Managed Care – PPO | Admitting: Gynecology

## 2015-07-06 VITALS — BP 120/76

## 2015-07-06 DIAGNOSIS — N939 Abnormal uterine and vaginal bleeding, unspecified: Secondary | ICD-10-CM | POA: Diagnosis not present

## 2015-07-06 DIAGNOSIS — N838 Other noninflammatory disorders of ovary, fallopian tube and broad ligament: Secondary | ICD-10-CM

## 2015-07-06 DIAGNOSIS — N839 Noninflammatory disorder of ovary, fallopian tube and broad ligament, unspecified: Secondary | ICD-10-CM

## 2015-07-06 DIAGNOSIS — D259 Leiomyoma of uterus, unspecified: Secondary | ICD-10-CM

## 2015-07-06 DIAGNOSIS — D500 Iron deficiency anemia secondary to blood loss (chronic): Secondary | ICD-10-CM

## 2015-07-06 DIAGNOSIS — D251 Intramural leiomyoma of uterus: Secondary | ICD-10-CM

## 2015-07-06 DIAGNOSIS — N920 Excessive and frequent menstruation with regular cycle: Secondary | ICD-10-CM

## 2015-07-06 DIAGNOSIS — N83201 Unspecified ovarian cyst, right side: Secondary | ICD-10-CM

## 2015-07-06 DIAGNOSIS — N921 Excessive and frequent menstruation with irregular cycle: Secondary | ICD-10-CM

## 2015-07-06 NOTE — Telephone Encounter (Signed)
-----   Message from Ramond Craver, [place] sent at 07/06/2015 12:33 PM EST ----- Regarding: MRI Per Dr. Loetta Rough " Arrange for MRI of the pelvis reference leiomyoma, 7 cm right ovarian mass".

## 2015-07-06 NOTE — Progress Notes (Signed)
Beverly Turner Nov 04, 1965 [id number]        49 y.o.  G1P1 Presents for sonohysterogram.  History of irregular menses with menorrhagia. Hemoglobin check at her primary physician's office 5. Follow up hemoglobin last week 6. Is on daily iron. Has Mirena IUD placed 2012. Prior myomectomy with known leiomyoma  Past medical history,surgical history, problem list, medications, allergies, family history and social history were all reviewed and documented in the EPIC chart.  Directed ROS with pertinent positives and negatives documented in the history of present illness/assessment and plan.  Exam: [place] assistant Filed Vitals:   07/06/15 0906  BP: 120/76   General appearance:  Normal Abdomen soft nontender without masses guarding rebound Pelvic external BUS vagina normal. Cervix normal. Uterus globoid 10 weeks size, midline mobile. Adnexa without grossly palpable masses.  Ultrasound shows enlarged uterus with multiple myomas the largest measuring 10 cm. Endometrial echo 5.4 mm. IUD not visible. Left ovary grossly normal. Right ovary enlarged at 7 cm total length. Multiple cystic and solid areas noted. Cul-de-sac with small amount of fluid.  Sonohysterogram performed, sterile technique single-toothed tenaculum anterior lip stabilization required with adequate distention. Large myoma impinging along the cavity and difficult to tell the extent of a submucous component. Endometrial biopsy taken. Patient tolerated well.  Assessment/Plan:  49 y.o. G1P1 with significant menorrhagia leading to significant anemia. Had Mirena IUD but is no longer in the cavity I suspect she passed this with one of her heavy bleeding episodes. Large myoma with multiple smaller ones. Cystic solid area right ovary at 7 cm total length. Somewhat difficult to visualize. Recommend Depo-Lupron for menstrual suppression and hemoglobin recovery. Continue on iron daily. Use backup contraception. Check CA-125 and schedule MRI for  better definition of the pelvis. Recommend ultimately proceeding with surgery after allowing for hemoglobin recovery assuming MRI reassuring and CA-125 negative. Possible robotic versus open reviewed with the advantages/disadvantages of each approach. Given the total picture I would proceed with an open TAH ovarian cystectomy/oophorectomy given her past history of myomectomy, large myoma and large cystic solid area on the ovary. Patient I will further discuss after biopsy, CA-125 and MRI results return.    Anastasio Auerbach MD, 9:26 AM 07/06/2015

## 2015-07-06 NOTE — Telephone Encounter (Signed)
Appointment on 07/20/15 @ 12:00pm left on voicemail per pt request. With the # to call if needed [phone]

## 2015-07-06 NOTE — Patient Instructions (Signed)
Office will call you with biopsy results and the blood test results.  Office will call you to arrange for the MRI and the Depo-Lupron shot.  Continue on the iron supplement daily.

## 2015-07-07 LAB — CA 125: CA 125: 86 U/mL — AB (ref <35)

## 2015-07-09 ENCOUNTER — Telehealth: Payer: Self-pay | Admitting: Gynecology

## 2015-07-09 ENCOUNTER — Telehealth: Payer: Self-pay | Admitting: *Deleted

## 2015-07-09 NOTE — Telephone Encounter (Signed)
Called and left message for kim at cancer center to call when the below is scheduled to relay to patient.

## 2015-07-09 NOTE — Telephone Encounter (Signed)
I called the patient today with her CA-125 results which returned elevated at 86. She has a history of multiple myomas and a cystic/solid area on her right ovary approximately 7 cm. She has a MRI scheduled for better definition of the right ovarian area 23 December. I recommended that she follow up with a gynecologic oncologist after that for consultation and probable surgery. I reviewed the differential to include both benign and malignant etiologies for an elevated CA-125 and she understands the situation and will follow up with the gynecologic oncologist after her MRI. I've encouraged her to call me if she has any further questions or concerns at all.

## 2015-07-09 NOTE — Telephone Encounter (Signed)
-----   Message from Anastasio Auerbach, MD sent at 07/09/2015  3:46 PM EST ----- Agent needs appointment with gynecologic oncologist scheduled after her 23 December MRI so that that's available to them, reference history of significant anemia, leiomyoma and a cystic solid 7 cm right ovarian mass with CA 125 results 86.

## 2015-07-10 ENCOUNTER — Telehealth: Payer: Self-pay

## 2015-07-10 NOTE — Telephone Encounter (Signed)
I called [employer] and spoke with Dev C. And received authorization for MRI scheduled on 07/20/15.  Auth # [id number] valid from today through 08/08/15.

## 2015-07-11 ENCOUNTER — Telehealth: Payer: Self-pay | Admitting: *Deleted

## 2015-07-11 NOTE — Telephone Encounter (Signed)
Appointment with Dr.Rossi on 07/27/15 @ 12:15pm pt informed with the below note.

## 2015-07-11 NOTE — Telephone Encounter (Signed)
Dr.Fontaine patient called today asking if you would give her a quick call when you have a chance at [phone]. She has a couple of questions 07/09/15 telephone encounter. Please advise

## 2015-07-11 NOTE — Telephone Encounter (Signed)
I returned the patient's call and she wanted to know what the endometrial biopsy results were from her sonohysterogram. It showed benign endometrium and we discussed this. She has her MRI planned next week with follow up appointment with gynecologic oncology 07/27/2015

## 2015-07-20 ENCOUNTER — Ambulatory Visit (HOSPITAL_COMMUNITY)
Admission: RE | Admit: 2015-07-20 | Discharge: 2015-07-20 | Disposition: A | Discharge status: Home / Self Care | Payer: BC Managed Care – PPO | Source: Ambulatory Visit | Attending: Gynecology | Admitting: Gynecology

## 2015-07-20 DIAGNOSIS — N839 Noninflammatory disorder of ovary, fallopian tube and broad ligament, unspecified: Secondary | ICD-10-CM | POA: Insufficient documentation

## 2015-07-20 DIAGNOSIS — D259 Leiomyoma of uterus, unspecified: Secondary | ICD-10-CM | POA: Diagnosis present

## 2015-07-20 DIAGNOSIS — N838 Other noninflammatory disorders of ovary, fallopian tube and broad ligament: Secondary | ICD-10-CM

## 2015-07-20 MED ORDER — GADOBENATE DIMEGLUMINE 529 MG/ML IV SOLN
20.0000 mL | Freq: Once | INTRAVENOUS | Status: AC | PRN
  Administered 2015-07-20: 17 mL via INTRAVENOUS

## 2015-07-25 ENCOUNTER — Telehealth: Payer: Self-pay

## 2015-07-25 NOTE — Telephone Encounter (Addendum)
[facility] called to say that they have patient's Lupron on hold as they have been unable to reach her. She provided the [facility] phone number [phone] that patient needs to call. I called patient and left detailed message per DPR access note and provided this number and explained importance of her calling to authorize shipment.  Of note, I asked her about the prior authorization form I had received that had incorrect provider info and diagnosis. She told me to ignore it that her Rx has already been filled so obviously that was not meant for our patient.

## 2015-07-25 NOTE — Telephone Encounter (Signed)
Patient called to let me know she did contact [facility] and they told her since new Rx for her that she would have to talk with the pharmacist to explain the medication. She is now awaiting a call from the pharmacist.

## 2015-07-26 ENCOUNTER — Telehealth: Payer: Self-pay

## 2015-07-26 NOTE — Telephone Encounter (Signed)
Patient called me to say that she spoke with [facility] today and her Lupron is being shipped to [place] and should arrive tomorrow. I told her I will call her as soon as we receive it and schedule an appointment for her to come and receive the injection.

## 2015-07-27 ENCOUNTER — Encounter: Payer: Self-pay | Admitting: Gynecologic Oncology

## 2015-07-27 ENCOUNTER — Ambulatory Visit: Payer: BC Managed Care – PPO | Attending: Gynecologic Oncology | Admitting: Gynecologic Oncology

## 2015-07-27 ENCOUNTER — Telehealth: Payer: Self-pay

## 2015-07-27 VITALS — BP 136/81 | HR 105 | Temp 98.1°F | Resp 20 | Ht 64.0 in | Wt 186.7 lb

## 2015-07-27 DIAGNOSIS — N83201 Unspecified ovarian cyst, right side: Secondary | ICD-10-CM

## 2015-07-27 DIAGNOSIS — R971 Elevated cancer antigen 125 [CA 125]: Secondary | ICD-10-CM

## 2015-07-27 DIAGNOSIS — N83209 Unspecified ovarian cyst, unspecified side: Secondary | ICD-10-CM | POA: Insufficient documentation

## 2015-07-27 NOTE — Progress Notes (Signed)
Consult Note: Gyn-Onc  Consult was requested by Dr. Phineas Real for the evaluation of Beverly Turner 49 y.o. female with symptomatic uterine fibroids, a right complex/solid ovarian mass and an elevated CA 125  CC:  Chief Complaint  Patient presents with  . Ovarian Cyst    New Consultation  . Elevated tumor maker    New Consultation    Assessment/Plan:  Beverly Turner  is a 49 y.o.  year old with symptomatic uterine fibroids, a right complex ovarian mass and elevated CA 125.  I personally reviewed her images from her MRI of the pelvis on 07/20/2015. I have a low suspicion for malignancy based on the appearance on imaging and the relatively small size of this mass. It may in fact be a pedunculated fibroid or fibrotehcoma. However given the elevated CA-125 and her symptoms feel that it is most appropriate to proceed with surgery. She is completed childbearing and desires definitive surgical procedure for her abnormal uterine bleeding and I believe she is a good candidate for a robotic-assisted total hysterectomy with right salpingo-oophorectomy. We will perform frozen section of the right ovary time of surgery. If it is benign we will not remove the left ovary if it appears grossly normal.  I explained that due to the large size of her uterus she will require either uterine morcellation (she has a normal endometrial biopsy from 07/06/2015), or minilaparotomy for specimen delivery.  I discussed that due to her prior history of an abdominal myomectomy she is increased risk for significant adhesive formation between the bowel, bladder, and uterus, and is at increased risk for visceral injury to GI or GU structures and time of surgery or postoperatively. I discussed that she also carries risks of bleeding, infection, reoperation, DVT, and perioperative death. I discussed that if malignancy is identified our frozen section we will perform the appropriate staging procedures as indicated disease (such  as lymphadenectomy and omentectomy).  I agree with plans for Lupron preoperatively to minimize perioperative blood loss anemia from heavy menstruation.   HPI: Beverly Turner is a 49 year old G1P1 who is seen in consultation at the request of Dr. Phineas Real for symptomatic uterine fibroids and a complex ovarian mass and elevated CA-125. The patient has a long-standing history of abnormal uterine bleeding as refractory to a Mirena IUD at hormonal suppression. 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 As part of the workup for her abnormal uterine bleeding she underwent ultrasound scan on 07/06/2015 which revealed a right ovary enlarged at 7 cm in total length of multiple cystic and solid areas noted. A CA-125 was drawn on 07/06/2015 and was elevated at 86 units per milliliter.   The uterus is enlarged measuring 12.3x8.6x6.6cm. On the MRI the right ovary  Is contiguous with a hypointense mass measuring 2.7x2.1x2.8cm. Differential diagnosis includes pedunculated fibroid vs ovarian fibrothecoma.  Endometrial biopsy on 07/06/15 revealed benign endometrium  She is otherwise very healthy with past history of abdominal myomectomy and a cesarean section.  She is overweight with a BMI of 32kg/m2.   Current Meds:  Outpatient Encounter Prescriptions as of 07/27/2015  Medication Sig  . IRON PO Take by mouth.  . Multiple Vitamin (MULTIVITAMIN) capsule Take 1 capsule by mouth daily.  Marland Kitchen omeprazole (PRILOSEC) 40 MG capsule Take 40 mg by mouth daily.   Facility-Administered Encounter Medications as of 07/27/2015  Medication  . levonorgestrel (MIRENA) 20 MCG/24HR IUD    Allergy:  Allergies  Allergen Reactions  . Penicillins Hives  .  Dust Mite Extract   . Pollen Extract     Social Hx:   Social History   Social History  . Marital Status: Married    Spouse Name: N/A  . Number of Children: N/A  . Years of Education: N/A   Occupational History  . Not on file.    Social History Main Topics  . Smoking status: Never Smoker   . Smokeless tobacco: Never Used  . Alcohol Use: Yes     Comment: rare  . Drug Use: No  . Sexual Activity: Yes    Birth Control/ Protection: IUD   Other Topics Concern  . Not on file   Social History Narrative    Past Surgical Hx:  Past Surgical History  Procedure Laterality Date  . Myomectomy abdominal approach  01/2003    MULTIPLE MYOMECTOMY  . Intrauterine device insertion  07/09/2011    MIRENA  Spontaneously extruded 06/2015 secondary to episodes of menorrhagia    Past Medical Hx:  Past Medical History  Diagnosis Date  . Seasonal allergies   . IBS (irritable bowel syndrome)   . Allergy   . Anemia   . GERD (gastroesophageal reflux disease)     Past Gynecological History:  G1 P1, c/s  No LMP recorded. Patient is not currently having periods (Reason: IUD).  Family Hx:  Family History  Problem Relation Age of Onset  . Hypertension Mother   . Hypertension Father   . Asthma Maternal Aunt     MATERNAL GREAT AUNT AGE 69'S  . Cancer Maternal Grandmother     LIVER CA    Review of Systems:  Constitutional  Feels well,    ENT Normal appearing ears and nares bilaterally Skin/Breast  No rash, sores, jaundice, itching, dryness Cardiovascular  No chest pain, shortness of breath, or edema  Pulmonary  No cough or wheeze.  Gastro Intestinal  No nausea, vomitting, or diarrhoea. No bright red blood per rectum, no abdominal pain, change in bowel movement, or constipation.  Genito Urinary  No frequency, urgency, dysuria, see HPI Musculo Skeletal  No myalgia, arthralgia, joint swelling or pain  Neurologic  No weakness, numbness, change in gait,  Psychology  No depression, anxiety, insomnia.   Vitals:  Blood pressure 136/81, pulse 105, temperature 98.1 F (36.7 C), temperature source Oral, resp. rate 20, height 5\' 4"  (1.626 m), weight 186 lb 11.2 oz (84.687 kg), SpO2 100 %.  Physical Exam: WD in  NAD Neck  Supple NROM, without any enlargements.  Lymph Node Survey No cervical supraclavicular or inguinal adenopathy Cardiovascular  Pulse normal rate, regularity and rhythm. S1 and S2 normal.  Lungs  Clear to auscultation bilateraly, without wheezes/crackles/rhonchi. Good air movement.  Skin  No rash/lesions/breakdown  Psychiatry  Alert and oriented to person, place, and time  Abdomen  Normoactive bowel sounds, abdomen soft, non-tender and overweight without evidence of hernia.  Back No CVA tenderness Genito Urinary  Vulva/vagina: Normal external female genitalia.  No lesions. No discharge or bleeding.  Bladder/urethra:  No lesions or masses, well supported bladder  Vagina: normal  Cervix: Normal appearing, no lesions.  Uterus:  Bulky 14-16 week size uterus, mobile.  Adnexa: no palpable masses. Rectal  Good tone, no masses no cul de sac nodularity.  Extremities  No bilateral cyanosis, clubbing or edema.   Donaciano Eva, MD  07/27/2015, 1:20 PM

## 2015-07-27 NOTE — Telephone Encounter (Signed)
I called patient and left message that her Lupron is here and to call me back and let me know when she would like to come by today to get the injection.

## 2015-07-27 NOTE — Patient Instructions (Signed)
Preparing for your Surgery  Plan for surgery on Feb 28 with Dr. Denman George at [facility] will be scheduled for a robotic assisted total hysterectomy, right salpingo-oophorectomy, possible staging, possible mini laparotomy.  Pre-operative Testing -You will receive a phone call from presurgical testing at [facility] to arrange for a pre-operative testing appointment before your surgery.  This appointment normally occurs one to two weeks before your scheduled surgery.   -Bring your insurance card, copy of an advanced directive if applicable, medication list  -At that visit, you will be asked to sign a consent for a possible blood transfusion in case a transfusion becomes necessary during surgery.  The need for a blood transfusion is rare but having consent is a necessary part of your care.     -You should not be taking blood thinners or aspirin at least ten days prior to surgery unless instructed by your surgeon.  Day Before Surgery at [place] will be asked to take in only clear liquids the day before surgery.  Examples of clear liquids include broths, jello, and clear juices.  Avoid carbonated beverages.  You will be advised to have nothing to eat or drink after midnight the evening before.    Your role in recovery Your role is to become active as soon as directed by your doctor, while still giving yourself time to heal.  Rest when you feel tired. You will be asked to do the following in order to speed your recovery:  - Cough and breathe deeply. This helps toclear and expand your lungs and can prevent pneumonia. You may be given a spirometer to practice deep breathing. A staff member will show you how to use the spirometer. - Do mild physical activity. Walking or moving your legs help your circulation and body functions return to normal. A staff member will help you when you try to walk and will provide you with simple exercises. Do not try to get up or walk  alone the first time. - Actively manage your pain. Managing your pain lets you move in comfort. We will ask you to rate your pain on a scale of zero to 10. It is your responsibility to tell your doctor or nurse where and how much you hurt so your pain can be treated.  Special Considerations -If you are diabetic, you may be placed on insulin after surgery to have closer control over your blood sugars to promote healing and recovery.  This does not mean that you will be discharged on insulin.  If applicable, your oral antidiabetics will be resumed when you are tolerating a solid diet.  -Your final pathology results from surgery should be available by the Friday after surgery and the results will be relayed to you when available.  Blood Transfusion Information WHAT IS A BLOOD TRANSFUSION? A transfusion is the replacement of blood or some of its parts. Blood is made up of multiple cells which provide different functions.  Red blood cells carry oxygen and are used for blood loss replacement.  White blood cells fight against infection.  Platelets control bleeding.  Plasma helps clot blood.  Other blood products are available for specialized needs, such as hemophilia or other clotting disorders. BEFORE THE TRANSFUSION  Who gives blood for transfusions?   You may be able to donate blood to be used at a later date on yourself (autologous donation).  Relatives can be asked to donate blood. This is generally not any safer than if you  have received blood from a stranger. The same precautions are taken to ensure safety when a relative's blood is donated.  Healthy volunteers who are fully evaluated to make sure their blood is safe. This is blood bank blood. Transfusion therapy is the safest it has ever been in the practice of medicine. Before blood is taken from a donor, a complete history is taken to make sure that person has no history of diseases nor engages in risky social behavior (examples are  intravenous drug use or sexual activity with multiple partners). The donor's travel history is screened to minimize risk of transmitting infections, such as malaria. The donated blood is tested for signs of infectious diseases, such as HIV and hepatitis. The blood is then tested to be sure it is compatible with you in order to minimize the chance of a transfusion reaction. If you or a relative donates blood, this is often done in anticipation of surgery and is not appropriate for emergency situations. It takes many days to process the donated blood. RISKS AND COMPLICATIONS Although transfusion therapy is very safe and saves many lives, the main dangers of transfusion include:   Getting an infectious disease.  Developing a transfusion reaction. This is an allergic reaction to something in the blood you were given. Every precaution is taken to prevent this. The decision to have a blood transfusion has been considered carefully by your caregiver before blood is given. Blood is not given unless the benefits outweigh the risks.

## 2015-07-31 ENCOUNTER — Ambulatory Visit (INDEPENDENT_AMBULATORY_CARE_PROVIDER_SITE_OTHER): Payer: BC Managed Care – PPO | Admitting: Gynecology

## 2015-07-31 DIAGNOSIS — D259 Leiomyoma of uterus, unspecified: Secondary | ICD-10-CM | POA: Diagnosis not present

## 2015-07-31 MED ORDER — LEUPROLIDE ACETATE (3 MONTH) 11.25 MG IM KIT
11.2500 mg | PACK | Freq: Once | INTRAMUSCULAR | Status: AC
  Administered 2015-07-31: 11.25 mg via INTRAMUSCULAR

## 2015-08-08 ENCOUNTER — Telehealth: Payer: Self-pay

## 2015-08-08 NOTE — Telephone Encounter (Signed)
Incoming call from a patient of Dr Everitt Amber , patient requesting to changes her scheduled surgery date from 09/25/2015 to 10/02/2015 related to personal business matters. Patient's surgery rescheduled to March 07,2017 at 07:30 AM per request , Dr Denman George and Joylene John , APNP updated with patient's request and that the surgery was rescheduled . Patient contacted and updated with requested changes , denies further questions at this time.

## 2015-08-14 ENCOUNTER — Telehealth: Payer: Self-pay | Admitting: *Deleted

## 2015-08-14 MED ORDER — TRAMADOL HCL 50 MG PO TABS: 50.0000 mg | ORAL_TABLET | Freq: Four times a day (QID) | ORAL | Status: DC | PRN

## 2015-08-14 NOTE — Telephone Encounter (Signed)
Pt aware Rx sent.  

## 2015-08-14 NOTE — Telephone Encounter (Signed)
Pt is scheduled for surgery on 10/02/15 with Dr.Rossi for hysterectomy, has fibroids, c/o cramping requesting Rx for cramping. Has taken OTC Motrin doesn't give much relief. Please advise

## 2015-08-14 NOTE — Telephone Encounter (Signed)
Can try tramadol 50 mg Q 6 hours PRN #30 with one refill

## 2015-09-25 NOTE — Patient Instructions (Addendum)
YOUR PROCEDURE IS SCHEDULED ON : 10/02/15  REPORT TO [facility] MAIN ENTRANCE FOLLOW SIGNS TO EAST ELEVATOR - GO TO 3rd FLOOR CHECK IN AT 3 EAST NURSES STATION (SHORT STAY) AT:  8:45 AM  CALL THIS NUMBER IF YOU HAVE PROBLEMS THE MORNING OF SURGERY [phone]  REMEMBER:ONLY 1 PER PERSON MAY GO TO SHORT STAY WITH YOU TO GET READY THE MORNING OF YOUR SURGERY  TAKE THESE MEDICINES THE MORNING OF SURGERY: ZYRTEC  DO NOT EAT FOOD OR DRINK LIQUIDS AFTER MIDNIGHT  CLEAR LIQUIDS ONLY THE DAY BEFORE SURGERY  CLEAR LIQUID DIET  Foods Allowed                                                                     Foods Excluded  Coffee and tea, regular and decaf                             liquids that you cannot  Plain Jell-O in any flavor                                             see through such as: Fruit ices (not with fruit pulp)                                     milk, soups, orange juice  Iced Popsicles                                                     All solid food Cranberry, grape and apple juices Sports drinks like Gatorade Lightly seasoned clear broth or consume(fat free) Sugar, honey syrup  Sample Menu Breakfast                                Lunch                                     Supper Cranberry juice                    Beef broth                            Chicken broth Jell-O                                     Grape juice                           Apple juice Coffee or tea  Jell-O                                      Popsicle                                                Coffee or tea                        Coffee or tea  ___________NO CARBONATED BEVERAGES__________________________________________________________    YOU MAY NOT HAVE ANY METAL ON YOUR BODY INCLUDING HAIR PINS AND PIERCING'S. DO NOT WEAR JEWELRY, MAKEUP, LOTIONS, POWDERS OR PERFUMES. DO NOT WEAR NAIL POLISH. DO NOT SHAVE 48 HRS PRIOR TO SURGERY. MEN MAY  SHAVE FACE AND NECK.  DO NOT [place]. [facility] IS NOT RESPONSIBLE FOR VALUABLES.  CONTACTS, DENTURES OR PARTIALS MAY NOT BE WORN TO SURGERY. LEAVE SUITCASE IN CAR. CAN BE BROUGHT TO ROOM AFTER SURGERY.  PATIENTS DISCHARGED THE DAY OF SURGERY WILL NOT BE ALLOWED TO DRIVE HOME.  PLEASE READ OVER THE FOLLOWING INSTRUCTION SHEETS _________________________________________________________________________________                                          [facility] - PREPARING FOR SURGERY  Before surgery, you can play an important role.  Because skin is not sterile, your skin needs to be as free of germs as possible.  You can reduce the number of germs on your skin by washing with CHG (chlorahexidine gluconate) soap before surgery.  CHG is an antiseptic cleaner which kills germs and bonds with the skin to continue killing germs even after washing. Please DO NOT use if you have an allergy to CHG or antibacterial soaps.  If your skin becomes reddened/irritated stop using the CHG and inform your nurse when you arrive at Short Stay. Do not shave (including legs and underarms) for at least 48 hours prior to the first CHG shower.  You may shave your face. Please follow these instructions carefully:   1.  Shower with CHG Soap the night before surgery and the  morning of Surgery.   2.  If you choose to wash your hair, wash your hair first as usual with your  normal  Shampoo.   3.  After you shampoo, rinse your hair and body thoroughly to remove the  shampoo.                                         4.  Use CHG as you would any other liquid soap.  You can apply chg directly  to the skin and wash . Gently wash with scrungie or clean wascloth    5.  Apply the CHG Soap to your body ONLY FROM THE NECK DOWN.   Do not use on open                           Wound or open sores. Avoid contact with eyes, ears mouth and genitals (private parts).  Genitals (private  parts) with your normal soap.              6.  Wash thoroughly, paying special attention to the area where your surgery  will be performed.   7.  Thoroughly rinse your body with warm water from the neck down.   8.  DO NOT shower/wash with your normal soap after using and rinsing off  the CHG Soap .                9.  Pat yourself dry with a clean towel.             10.  Wear clean night clothes to bed after shower             11.  Place clean sheets on your bed the night of your first shower and do not  sleep with pets.  Day of Surgery : Do not apply any lotions/deodorants the morning of surgery.  Please wear clean clothes to the hospital/surgery center.  FAILURE TO FOLLOW THESE INSTRUCTIONS MAY RESULT IN THE CANCELLATION OF YOUR SURGERY    PATIENT SIGNATURE_________________________________  ______________________________________________________________________     Adam Phenix  An incentive spirometer is a tool that can help keep your lungs clear and active. This tool measures how well you are filling your lungs with each breath. Taking long deep breaths may help reverse or decrease the chance of developing breathing (pulmonary) problems (especially infection) following:  A long period of time when you are unable to move or be active. BEFORE THE PROCEDURE   If the spirometer includes an indicator to show your best effort, your nurse or respiratory therapist will set it to a desired goal.  If possible, sit up straight or lean slightly forward. Try not to slouch.  Hold the incentive spirometer in an upright position. INSTRUCTIONS FOR USE  1. Sit on the edge of your bed if possible, or sit up as far as you can in bed or on a chair. 2. Hold the incentive spirometer in an upright position. 3. Breathe out normally. 4. Place the mouthpiece in your mouth and seal your lips tightly around it. 5. Breathe in slowly and as deeply as possible, raising the piston or the ball  toward the top of the column. 6. Hold your breath for 3-5 seconds or for as long as possible. Allow the piston or ball to fall to the bottom of the column. 7. Remove the mouthpiece from your mouth and breathe out normally. 8. Rest for a few seconds and repeat Steps 1 through 7 at least 10 times every 1-2 hours when you are awake. Take your time and take a few normal breaths between deep breaths. 9. The spirometer may include an indicator to show your best effort. Use the indicator as a goal to work toward during each repetition. 10. After each set of 10 deep breaths, practice coughing to be sure your lungs are clear. If you have an incision (the cut made at the time of surgery), support your incision when coughing by placing a pillow or rolled up towels firmly against it. Once you are able to get out of bed, walk around indoors and cough well. You may stop using the incentive spirometer when instructed by your caregiver.  RISKS AND COMPLICATIONS  Take your time so you do not get dizzy or light-headed.  If you are in pain, you may need to take or ask for pain medication before doing incentive spirometry. It  is harder to take a deep breath if you are having pain. AFTER USE  Rest and breathe slowly and easily.  It can be helpful to keep track of a log of your progress. Your caregiver can provide you with a simple table to help with this. If you are using the spirometer at home, follow these instructions: [place] IF:   You are having difficultly using the spirometer.  You have trouble using the spirometer as often as instructed.  Your pain medication is not giving enough relief while using the spirometer.  You develop fever of 100.5 F (38.1 C) or higher. SEEK IMMEDIATE MEDICAL CARE IF:   You cough up bloody sputum that had not been present before.  You develop fever of 102 F (38.9 C) or greater.  You develop worsening pain at or near the incision site. MAKE SURE YOU:    Understand these instructions.  Will watch your condition.  Will get help right away if you are not doing well or get worse. Document Released: 11/24/2006 Document Revised: 10/06/2011 Document Reviewed: 01/25/2007 [facility] Patient Information 2014 [place], [place].   ________________________________________________________________________

## 2015-09-26 ENCOUNTER — Encounter (HOSPITAL_COMMUNITY)
Admission: RE | Admit: 2015-09-26 | Discharge: 2015-09-26 | Disposition: A | Discharge status: Home / Self Care | Payer: BC Managed Care – PPO | Source: Ambulatory Visit | Attending: Gynecologic Oncology | Admitting: Gynecologic Oncology

## 2015-09-26 ENCOUNTER — Encounter (HOSPITAL_COMMUNITY): Payer: Self-pay

## 2015-09-26 DIAGNOSIS — Z0183 Encounter for blood typing: Secondary | ICD-10-CM | POA: Diagnosis not present

## 2015-09-26 DIAGNOSIS — Z01812 Encounter for preprocedural laboratory examination: Secondary | ICD-10-CM | POA: Insufficient documentation

## 2015-09-26 DIAGNOSIS — N83209 Unspecified ovarian cyst, unspecified side: Secondary | ICD-10-CM | POA: Insufficient documentation

## 2015-09-26 HISTORY — DX: Unspecified ovarian cyst, unspecified side: N83.209

## 2015-09-26 HISTORY — DX: Dermatitis, unspecified: L30.9

## 2015-09-26 LAB — CBC WITH DIFFERENTIAL/PLATELET
Basophils Absolute: 0 10*3/uL (ref 0.0–0.1)
Basophils Relative: 1 %
EOS PCT: 2 %
Eosinophils Absolute: 0.1 10*3/uL (ref 0.0–0.7)
HCT: 40 % (ref 36.0–46.0)
Hemoglobin: 11 g/dL — ABNORMAL LOW (ref 12.0–15.0)
LYMPHS ABS: 1 10*3/uL (ref 0.7–4.0)
LYMPHS PCT: 20 %
MCH: 24.1 pg — AB (ref 26.0–34.0)
MCHC: 27.5 g/dL — ABNORMAL LOW (ref 30.0–36.0)
MCV: 87.7 fL (ref 78.0–100.0)
MONO ABS: 0.3 10*3/uL (ref 0.1–1.0)
Monocytes Relative: 6 %
Neutro Abs: 3.5 10*3/uL (ref 1.7–7.7)
Neutrophils Relative %: 71 %
PLATELETS: 438 10*3/uL — AB (ref 150–400)
RBC: 4.56 MIL/uL (ref 3.87–5.11)
RDW: 16.1 % — AB (ref 11.5–15.5)
WBC: 5 10*3/uL (ref 4.0–10.5)

## 2015-09-26 LAB — COMPREHENSIVE METABOLIC PANEL
ALT: 18 U/L (ref 14–54)
AST: 24 U/L (ref 15–41)
Albumin: 4.4 g/dL (ref 3.5–5.0)
Alkaline Phosphatase: 60 U/L (ref 38–126)
Anion gap: 8 (ref 5–15)
BUN: 12 mg/dL (ref 6–20)
CHLORIDE: 104 mmol/L (ref 101–111)
CO2: 28 mmol/L (ref 22–32)
Calcium: 10.1 mg/dL (ref 8.9–10.3)
Creatinine, Ser: 0.72 mg/dL (ref 0.44–1.00)
Glucose, Bld: 86 mg/dL (ref 65–99)
POTASSIUM: 4.4 mmol/L (ref 3.5–5.1)
Sodium: 140 mmol/L (ref 135–145)
TOTAL PROTEIN: 7.7 g/dL (ref 6.5–8.1)
Total Bilirubin: 0.3 mg/dL (ref 0.3–1.2)

## 2015-09-26 LAB — URINALYSIS, ROUTINE W REFLEX MICROSCOPIC
Bilirubin Urine: NEGATIVE
GLUCOSE, UA: NEGATIVE mg/dL
Ketones, ur: NEGATIVE mg/dL
Leukocytes, UA: NEGATIVE
Nitrite: NEGATIVE
PH: 6 (ref 5.0–8.0)
Protein, ur: NEGATIVE mg/dL
SPECIFIC GRAVITY, URINE: 1.015 (ref 1.005–1.030)

## 2015-09-26 LAB — PREGNANCY, URINE: PREG TEST UR: NEGATIVE

## 2015-09-26 LAB — URINE MICROSCOPIC-ADD ON
BACTERIA UA: NONE SEEN
Squamous Epithelial / LPF: NONE SEEN

## 2015-09-26 LAB — ABO/RH: ABO/RH(D): O POS

## 2015-10-02 ENCOUNTER — Ambulatory Visit (HOSPITAL_COMMUNITY): Payer: BC Managed Care – PPO | Admitting: Certified Registered Nurse Anesthetist

## 2015-10-02 ENCOUNTER — Encounter (HOSPITAL_COMMUNITY): Payer: Self-pay | Admitting: *Deleted

## 2015-10-02 ENCOUNTER — Ambulatory Visit (HOSPITAL_COMMUNITY)
Admission: RE | Admit: 2015-10-02 | Discharge: 2015-10-03 | Disposition: A | Discharge status: Home / Self Care | Payer: BC Managed Care – PPO | Source: Ambulatory Visit | Attending: Gynecologic Oncology | Admitting: Gynecologic Oncology

## 2015-10-02 ENCOUNTER — Encounter (HOSPITAL_COMMUNITY)
Admission: RE | Disposition: A | Discharge status: Home / Self Care | Payer: Self-pay | Source: Ambulatory Visit | Attending: Gynecologic Oncology

## 2015-10-02 DIAGNOSIS — K219 Gastro-esophageal reflux disease without esophagitis: Secondary | ICD-10-CM | POA: Insufficient documentation

## 2015-10-02 DIAGNOSIS — D649 Anemia, unspecified: Secondary | ICD-10-CM | POA: Diagnosis not present

## 2015-10-02 DIAGNOSIS — R1900 Intra-abdominal and pelvic swelling, mass and lump, unspecified site: Secondary | ICD-10-CM | POA: Diagnosis present

## 2015-10-02 DIAGNOSIS — D259 Leiomyoma of uterus, unspecified: Secondary | ICD-10-CM | POA: Insufficient documentation

## 2015-10-02 DIAGNOSIS — N838 Other noninflammatory disorders of ovary, fallopian tube and broad ligament: Secondary | ICD-10-CM | POA: Diagnosis present

## 2015-10-02 DIAGNOSIS — N83201 Unspecified ovarian cyst, right side: Secondary | ICD-10-CM

## 2015-10-02 DIAGNOSIS — N83209 Unspecified ovarian cyst, unspecified side: Secondary | ICD-10-CM | POA: Diagnosis present

## 2015-10-02 DIAGNOSIS — K660 Peritoneal adhesions (postprocedural) (postinfection): Secondary | ICD-10-CM | POA: Insufficient documentation

## 2015-10-02 DIAGNOSIS — D200 Benign neoplasm of soft tissue of retroperitoneum: Secondary | ICD-10-CM | POA: Diagnosis not present

## 2015-10-02 DIAGNOSIS — D270 Benign neoplasm of right ovary: Secondary | ICD-10-CM | POA: Insufficient documentation

## 2015-10-02 DIAGNOSIS — R971 Elevated cancer antigen 125 [CA 125]: Secondary | ICD-10-CM | POA: Diagnosis not present

## 2015-10-02 DIAGNOSIS — D251 Intramural leiomyoma of uterus: Secondary | ICD-10-CM | POA: Diagnosis present

## 2015-10-02 DIAGNOSIS — N939 Abnormal uterine and vaginal bleeding, unspecified: Secondary | ICD-10-CM | POA: Insufficient documentation

## 2015-10-02 HISTORY — PX: ROBOTIC ASSISTED TOTAL HYSTERECTOMY WITH BILATERAL SALPINGO OOPHERECTOMY: SHX6086

## 2015-10-02 LAB — TYPE AND SCREEN
ABO/RH(D): O POS
ANTIBODY SCREEN: NEGATIVE

## 2015-10-02 SURGERY — HYSTERECTOMY, TOTAL, ROBOT-ASSISTED, LAPAROSCOPIC, WITH BILATERAL SALPINGO-OOPHORECTOMY
Anesthesia: General

## 2015-10-02 MED ORDER — LACTATED RINGERS IV SOLN
INTRAVENOUS | Status: DC | PRN
  Administered 2015-10-02 (×2): via INTRAVENOUS

## 2015-10-02 MED ORDER — SUGAMMADEX SODIUM 200 MG/2ML IV SOLN
INTRAVENOUS | Status: AC
Start: 2015-10-02 — End: 2015-10-02
  Filled 2015-10-02: qty 2

## 2015-10-02 MED ORDER — DEXAMETHASONE SODIUM PHOSPHATE 10 MG/ML IJ SOLN
INTRAMUSCULAR | Status: DC | PRN
  Administered 2015-10-02: 10 mg via INTRAVENOUS

## 2015-10-02 MED ORDER — CIPROFLOXACIN IN D5W 400 MG/200ML IV SOLN
INTRAVENOUS | Status: AC
  Filled 2015-10-02: qty 200

## 2015-10-02 MED ORDER — PROPOFOL 10 MG/ML IV BOLUS
INTRAVENOUS | Status: DC | PRN
  Administered 2015-10-02: 160 mg via INTRAVENOUS

## 2015-10-02 MED ORDER — KETOROLAC TROMETHAMINE 15 MG/ML IJ SOLN
15.0000 mg | Freq: Four times a day (QID) | INTRAMUSCULAR | Status: AC
  Administered 2015-10-02 – 2015-10-03 (×4): 15 mg via INTRAVENOUS
  Filled 2015-10-02 (×4): qty 1

## 2015-10-02 MED ORDER — LACTATED RINGERS IV SOLN
INTRAVENOUS | Status: DC | PRN
  Administered 2015-10-02: 1000 mL

## 2015-10-02 MED ORDER — IBUPROFEN 800 MG PO TABS: 800.0000 mg | ORAL_TABLET | Freq: Three times a day (TID) | ORAL | Status: DC | PRN

## 2015-10-02 MED ORDER — FENTANYL CITRATE (PF) 250 MCG/5ML IJ SOLN
INTRAMUSCULAR | Status: AC
  Filled 2015-10-02: qty 5

## 2015-10-02 MED ORDER — STERILE WATER FOR IRRIGATION IR SOLN
Status: DC | PRN
  Administered 2015-10-02: 1000 mL

## 2015-10-02 MED ORDER — SUCCINYLCHOLINE CHLORIDE 20 MG/ML IJ SOLN
INTRAMUSCULAR | Status: DC | PRN
  Administered 2015-10-02: 100 mg via INTRAVENOUS

## 2015-10-02 MED ORDER — ONDANSETRON HCL 4 MG PO TABS
4.0000 mg | ORAL_TABLET | Freq: Four times a day (QID) | ORAL | Status: DC | PRN
Start: 2015-10-02 — End: 2015-10-03

## 2015-10-02 MED ORDER — ROCURONIUM BROMIDE 100 MG/10ML IV SOLN
INTRAVENOUS | Status: DC | PRN
  Administered 2015-10-02: 50 mg via INTRAVENOUS

## 2015-10-02 MED ORDER — ENOXAPARIN SODIUM 40 MG/0.4ML ~~LOC~~ SOLN
40.0000 mg | SUBCUTANEOUS | Status: AC
  Administered 2015-10-02: 40 mg via SUBCUTANEOUS
  Filled 2015-10-02: qty 0.4

## 2015-10-02 MED ORDER — SUGAMMADEX SODIUM 200 MG/2ML IV SOLN
INTRAVENOUS | Status: DC | PRN
  Administered 2015-10-02: 200 mg via INTRAVENOUS

## 2015-10-02 MED ORDER — ONDANSETRON HCL 4 MG/2ML IJ SOLN
INTRAMUSCULAR | Status: DC | PRN
  Administered 2015-10-02: 4 mg via INTRAVENOUS

## 2015-10-02 MED ORDER — SODIUM CHLORIDE 0.9 % IJ SOLN
INTRAMUSCULAR | Status: AC
  Filled 2015-10-02: qty 10

## 2015-10-02 MED ORDER — ROCURONIUM BROMIDE 100 MG/10ML IV SOLN
INTRAVENOUS | Status: AC
  Filled 2015-10-02: qty 1

## 2015-10-02 MED ORDER — ONDANSETRON HCL 4 MG/2ML IJ SOLN
INTRAMUSCULAR | Status: AC
  Filled 2015-10-02: qty 2

## 2015-10-02 MED ORDER — HYDROMORPHONE HCL 1 MG/ML IJ SOLN
INTRAMUSCULAR | Status: DC | PRN
  Administered 2015-10-02 (×5): .2 mg via INTRAVENOUS

## 2015-10-02 MED ORDER — MIDAZOLAM HCL 5 MG/5ML IJ SOLN
INTRAMUSCULAR | Status: DC | PRN
  Administered 2015-10-02: 2 mg via INTRAVENOUS

## 2015-10-02 MED ORDER — PROMETHAZINE HCL 25 MG/ML IJ SOLN: 6.2500 mg | INTRAMUSCULAR | Status: DC | PRN

## 2015-10-02 MED ORDER — FENTANYL CITRATE (PF) 100 MCG/2ML IJ SOLN
INTRAMUSCULAR | Status: DC | PRN
  Administered 2015-10-02: 100 ug via INTRAVENOUS
  Administered 2015-10-02: 50 ug via INTRAVENOUS
  Administered 2015-10-02: 100 ug via INTRAVENOUS

## 2015-10-02 MED ORDER — PROPOFOL 10 MG/ML IV BOLUS
INTRAVENOUS | Status: AC
  Filled 2015-10-02: qty 20

## 2015-10-02 MED ORDER — CLINDAMYCIN PHOSPHATE 900 MG/50ML IV SOLN
INTRAVENOUS | Status: AC
  Filled 2015-10-02: qty 50

## 2015-10-02 MED ORDER — DEXAMETHASONE SODIUM PHOSPHATE 10 MG/ML IJ SOLN
INTRAMUSCULAR | Status: AC
  Filled 2015-10-02: qty 1

## 2015-10-02 MED ORDER — GABAPENTIN 300 MG PO CAPS
600.0000 mg | ORAL_CAPSULE | Freq: Every day | ORAL | Status: AC
  Administered 2015-10-02: 600 mg via ORAL
  Filled 2015-10-02: qty 2

## 2015-10-02 MED ORDER — HYDROMORPHONE HCL 1 MG/ML IJ SOLN
0.2000 mg | INTRAMUSCULAR | Status: DC | PRN
  Administered 2015-10-02: 0.6 mg via INTRAVENOUS
  Filled 2015-10-02: qty 1

## 2015-10-02 MED ORDER — LORATADINE 10 MG PO TABS
10.0000 mg | ORAL_TABLET | Freq: Every day | ORAL | Status: DC
  Administered 2015-10-02 – 2015-10-03 (×2): 10 mg via ORAL
  Filled 2015-10-02 (×3): qty 1

## 2015-10-02 MED ORDER — ARTIFICIAL TEARS OP OINT
TOPICAL_OINTMENT | OPHTHALMIC | Status: AC
Start: 2015-10-02 — End: 2015-10-02
  Filled 2015-10-02: qty 3.5

## 2015-10-02 MED ORDER — CIPROFLOXACIN IN D5W 400 MG/200ML IV SOLN
400.0000 mg | INTRAVENOUS | Status: AC
  Administered 2015-10-02: 400 mg via INTRAVENOUS

## 2015-10-02 MED ORDER — HYDROMORPHONE HCL 2 MG/ML IJ SOLN
INTRAMUSCULAR | Status: AC
  Filled 2015-10-02: qty 1

## 2015-10-02 MED ORDER — KCL IN DEXTROSE-NACL 20-5-0.45 MEQ/L-%-% IV SOLN
INTRAVENOUS | Status: DC
  Administered 2015-10-02: 15:00:00 via INTRAVENOUS
  Filled 2015-10-02 (×2): qty 1000

## 2015-10-02 MED ORDER — LIDOCAINE HCL (CARDIAC) 20 MG/ML IV SOLN
INTRAVENOUS | Status: DC | PRN
  Administered 2015-10-02: 100 mg via INTRAVENOUS

## 2015-10-02 MED ORDER — OXYCODONE-ACETAMINOPHEN 5-325 MG PO TABS: 1.0000 | ORAL_TABLET | ORAL | Status: DC | PRN

## 2015-10-02 MED ORDER — MEPERIDINE HCL 50 MG/ML IJ SOLN: 6.2500 mg | INTRAMUSCULAR | Status: DC | PRN

## 2015-10-02 MED ORDER — HYDROMORPHONE HCL 1 MG/ML IJ SOLN
0.2500 mg | INTRAMUSCULAR | Status: DC | PRN
  Administered 2015-10-02: 0.5 mg via INTRAVENOUS

## 2015-10-02 MED ORDER — LIDOCAINE HCL (CARDIAC) 20 MG/ML IV SOLN
INTRAVENOUS | Status: AC
  Filled 2015-10-02: qty 5

## 2015-10-02 MED ORDER — MIDAZOLAM HCL 2 MG/2ML IJ SOLN
INTRAMUSCULAR | Status: AC
  Filled 2015-10-02: qty 2

## 2015-10-02 MED ORDER — HYDROMORPHONE HCL 1 MG/ML IJ SOLN
INTRAMUSCULAR | Status: AC
  Filled 2015-10-02: qty 1

## 2015-10-02 MED ORDER — CLINDAMYCIN PHOSPHATE 900 MG/50ML IV SOLN
900.0000 mg | INTRAVENOUS | Status: AC
  Administered 2015-10-02: 900 mg via INTRAVENOUS

## 2015-10-02 MED ORDER — ONDANSETRON HCL 4 MG/2ML IJ SOLN
4.0000 mg | Freq: Four times a day (QID) | INTRAMUSCULAR | Status: DC | PRN
  Administered 2015-10-02: 4 mg via INTRAVENOUS
  Filled 2015-10-02: qty 2

## 2015-10-02 SURGICAL SUPPLY — 49 items
APL ESCP 34 STRL LF DISP (HEMOSTASIS)
APPLICATOR SURGIFLO ENDO (HEMOSTASIS) IMPLANT
BAG SPEC RTRVL LRG 6X4 10 (ENDOMECHANICALS)
CHLORAPREP W/TINT 26ML (MISCELLANEOUS) ×2 IMPLANT
COVER SURGICAL LIGHT HANDLE (MISCELLANEOUS) ×3 IMPLANT
COVER TIP SHEARS 8 DVNC (MISCELLANEOUS) ×1 IMPLANT
COVER TIP SHEARS 8MM DA VINCI (MISCELLANEOUS) ×1
DRAPE ARM DVNC X/XI (DISPOSABLE) ×4 IMPLANT
DRAPE COLUMN DVNC XI (DISPOSABLE) ×1 IMPLANT
DRAPE DA VINCI XI ARM (DISPOSABLE) ×4
DRAPE DA VINCI XI COLUMN (DISPOSABLE) ×1
DRAPE SHEET LG 3/4 BI-LAMINATE (DRAPES) ×4 IMPLANT
DRAPE SURG IRRIG POUCH 19X23 (DRAPES) ×2 IMPLANT
ELECT REM PT RETURN 9FT ADLT (ELECTROSURGICAL) ×2
ELECTRODE REM PT RTRN 9FT ADLT (ELECTROSURGICAL) ×1 IMPLANT
GLOVE BIO SURGEON STRL SZ 6 (GLOVE) ×8 IMPLANT
GLOVE BIO SURGEON STRL SZ 6.5 (GLOVE) ×4 IMPLANT
GOWN STRL REUS W/ TWL LRG LVL3 (GOWN DISPOSABLE) ×2 IMPLANT
GOWN STRL REUS W/TWL LRG LVL3 (GOWN DISPOSABLE) ×4
HOLDER FOLEY CATH W/STRAP (MISCELLANEOUS) ×2 IMPLANT
KIT BASIN OR (CUSTOM PROCEDURE TRAY) ×2 IMPLANT
LIQUID BAND (GAUZE/BANDAGES/DRESSINGS) ×2 IMPLANT
MANIPULATOR UTERINE 4.5 ZUMI (MISCELLANEOUS) ×2 IMPLANT
MARKER SKIN DUAL TIP RULER LAB (MISCELLANEOUS) ×2 IMPLANT
OBTURATOR XI 8MM BLADELESS (TROCAR) ×2 IMPLANT
OCCLUDER COLPOPNEUMO (BALLOONS) ×2 IMPLANT
PAD POSITIONING PINK XL (MISCELLANEOUS) ×2 IMPLANT
PORT ACCESS TROCAR AIRSEAL 12 (TROCAR) ×1 IMPLANT
PORT ACCESS TROCAR AIRSEAL 5M (TROCAR) ×1
POUCH ENDO CATCH II 15MM (MISCELLANEOUS) ×1 IMPLANT
POUCH SPECIMEN RETRIEVAL 10MM (ENDOMECHANICALS) IMPLANT
SEAL CANN UNIV 5-8 DVNC XI (MISCELLANEOUS) ×4 IMPLANT
SEAL XI 5MM-8MM UNIVERSAL (MISCELLANEOUS) ×4
SET TRI-LUMEN FLTR TB AIRSEAL (TUBING) ×2 IMPLANT
SET TUBE IRRIG SUCTION NO TIP (IRRIGATION / IRRIGATOR) ×2 IMPLANT
SHEET LAVH (DRAPES) ×2 IMPLANT
SOLUTION ELECTROLUBE (MISCELLANEOUS) ×2 IMPLANT
SURGIFLO W/THROMBIN 8M KIT (HEMOSTASIS) IMPLANT
SUT MNCRL AB 4-0 PS2 18 (SUTURE) ×4 IMPLANT
SUT VIC AB 0 CT1 27 (SUTURE) ×2
SUT VIC AB 0 CT1 27XBRD ANTBC (SUTURE) ×1 IMPLANT
SYR 50ML LL SCALE MARK (SYRINGE) ×2 IMPLANT
TOWEL OR 17X26 10 PK STRL BLUE (TOWEL DISPOSABLE) ×4 IMPLANT
TOWEL OR NON WOVEN STRL DISP B (DISPOSABLE) ×2 IMPLANT
TRAP SPECIMEN MUCOUS 40CC (MISCELLANEOUS) IMPLANT
TRAY FOLEY W/METER SILVER 14FR (SET/KITS/TRAYS/PACK) ×2 IMPLANT
TRAY LAPAROSCOPIC (CUSTOM PROCEDURE TRAY) ×2 IMPLANT
TROCAR BLADELESS OPT 5 100 (ENDOMECHANICALS) ×2 IMPLANT
WATER STERILE IRR 1500ML POUR (IV SOLUTION) ×4 IMPLANT

## 2015-10-02 NOTE — Transfer of Care (Signed)
Immediate Anesthesia Transfer of Care Note  Patient: Beverly Turner  Procedure(s) Performed: Procedure(s): XI ROBOTIC ASSISTED TOTAL HYSTERECTOMY UTERUS GREATER THAN 250 GRAMS, WITH RIGHT SALPINGO OOPHORECTOMY, LEFT SALPINGECTOMY (N/A)  Patient Location: PACU  Anesthesia Type:General  Level of Consciousness:  sedated, patient cooperative and responds to stimulation  Airway & Oxygen Therapy:Patient Spontanous Breathing and Patient connected to face mask oxgen  Post-op Assessment:  Report given to PACU RN and Post -op Vital signs reviewed and stable  Post vital signs:  Reviewed and stable  Last Vitals:  Filed Vitals:   10/02/15 0848  BP: 116/77  Pulse: 78  Temp: 36.6 C  Resp: 16    Complications: No apparent anesthesia complications

## 2015-10-02 NOTE — Anesthesia Preprocedure Evaluation (Signed)
Anesthesia Evaluation  Patient identified by MRN, date of birth, ID band Patient awake    Reviewed: Allergy & Precautions, NPO status , Patient's Chart, lab work & pertinent test results  Airway Mallampati: II  TM Distance: >3 FB Neck ROM: Full    Dental no notable dental hx.    Pulmonary neg pulmonary ROS,    Pulmonary exam normal breath sounds clear to auscultation       Cardiovascular negative cardio ROS Normal cardiovascular exam Rhythm:Regular Rate:Normal     Neuro/Psych negative neurological ROS  negative psych ROS   GI/Hepatic Neg liver ROS, GERD  ,  Endo/Other  negative endocrine ROS  Renal/GU negative Renal ROS     Musculoskeletal negative musculoskeletal ROS (+)   Abdominal   Peds  Hematology  (+) anemia ,   Anesthesia Other Findings   Reproductive/Obstetrics negative OB ROS                             Anesthesia Physical Anesthesia Plan  ASA: II  Anesthesia Plan: General   Post-op Pain Management:    Induction: Intravenous  Airway Management Planned: Oral ETT  Additional Equipment:   Intra-op Plan:   Post-operative Plan: Extubation in OR  Informed Consent: I have reviewed the patients History and Physical, chart, labs and discussed the procedure including the risks, benefits and alternatives for the proposed anesthesia with the patient or authorized representative who has indicated his/her understanding and acceptance.   Dental advisory given  Plan Discussed with: CRNA  Anesthesia Plan Comments:         Anesthesia Quick Evaluation

## 2015-10-02 NOTE — H&P (Signed)
Consult was requested by Dr. Phineas Real for the evaluation of Beverly Turner 50 y.o. female with symptomatic uterine fibroids, a right complex/solid ovarian mass and an elevated CA 125  CC:  Chief Complaint  Patient presents with  . Ovarian Cyst    New Consultation  . Elevated tumor maker    New Consultation    Assessment/Plan:  Beverly Turner is a 50 y.o. year old with symptomatic uterine fibroids, a right complex ovarian mass and elevated CA 125.  I personally reviewed her images from her MRI of the pelvis on 07/20/2015. I have a low suspicion for malignancy based on the appearance on imaging and the relatively small size of this mass. It may in fact be a pedunculated fibroid or fibrotehcoma. However given the elevated CA-125 and her symptoms feel that it is most appropriate to proceed with surgery. She is completed childbearing and desires definitive surgical procedure for her abnormal uterine bleeding and I believe she is a good candidate for a robotic-assisted total hysterectomy with right salpingo-oophorectomy. We will perform frozen section of the right ovary time of surgery. If it is benign we will not remove the left ovary if it appears grossly normal.  I explained that due to the large size of her uterus she will require either uterine morcellation (she has a normal endometrial biopsy from 07/06/2015), or minilaparotomy for specimen delivery.  I discussed that due to her prior history of an abdominal myomectomy she is increased risk for significant adhesive formation between the bowel, bladder, and uterus, and is at increased risk for visceral injury to GI or GU structures and time of surgery or postoperatively. I discussed that she also carries risks of bleeding, infection, reoperation, DVT, and perioperative death. I discussed that if malignancy is identified our frozen section we will perform the appropriate staging procedures as indicated disease (such as  lymphadenectomy and omentectomy).  I agree with plans for Lupron preoperatively to minimize perioperative blood loss anemia from heavy menstruation.   HPI: Beverly Turner is a 50 year old G1P1 who is seen in consultation at the request of Dr. Phineas Real for symptomatic uterine fibroids and a complex ovarian mass and elevated CA-125. The patient has a long-standing history of abnormal uterine bleeding as refractory to a Mirena IUD at hormonal suppression. 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 As part of the workup for her abnormal uterine bleeding she underwent ultrasound scan on 07/06/2015 which revealed a right ovary enlarged at 7 cm in total length of multiple cystic and solid areas noted. A CA-125 was drawn on 07/06/2015 and was elevated at 86 units per milliliter.   The uterus is enlarged measuring 12.3x8.6x6.6cm. On the MRI the right ovary Is contiguous with a hypointense mass measuring 2.7x2.1x2.8cm. Differential diagnosis includes pedunculated fibroid vs ovarian fibrothecoma.  Endometrial biopsy on 07/06/15 revealed benign endometrium  She is otherwise very healthy with past history of abdominal myomectomy and a cesarean section.  She is overweight with a BMI of 32kg/m2.   Current Meds:  Outpatient Encounter Prescriptions as of 07/27/2015  Medication Sig  . IRON PO Take by mouth.  . Multiple Vitamin (MULTIVITAMIN) capsule Take 1 capsule by mouth daily.  Marland Kitchen omeprazole (PRILOSEC) 40 MG capsule Take 40 mg by mouth daily.   Facility-Administered Encounter Medications as of 07/27/2015  Medication  . levonorgestrel (MIRENA) 20 MCG/24HR IUD    Allergy:  Allergies  Allergen Reactions  . Penicillins Hives  . Dust Mite Extract   .  Pollen Extract     Social Hx:  Social History   Social History  . Marital Status: Married    Spouse Name: N/A  . Number of Children: N/A  . Years of Education:  N/A   Occupational History  . Not on file.   Social History Main Topics  . Smoking status: Never Smoker   . Smokeless tobacco: Never Used  . Alcohol Use: Yes     Comment: rare  . Drug Use: No  . Sexual Activity: Yes    Birth Control/ Protection: IUD   Other Topics Concern  . Not on file   Social History Narrative    Past Surgical Hx:  Past Surgical History  Procedure Laterality Date  . Myomectomy abdominal approach  01/2003    MULTIPLE MYOMECTOMY  . Intrauterine device insertion  07/09/2011    MIRENA Spontaneously extruded 06/2015 secondary to episodes of menorrhagia    Past Medical Hx:  Past Medical History  Diagnosis Date  . Seasonal allergies   . IBS (irritable bowel syndrome)   . Allergy   . Anemia   . GERD (gastroesophageal reflux disease)     Past Gynecological History: G1 P1, c/s No LMP recorded. Patient is not currently having periods (Reason: IUD).  Family Hx:  Family History  Problem Relation Age of Onset  . Hypertension Mother   . Hypertension Father   . Asthma Maternal Aunt     MATERNAL GREAT AUNT AGE 53'S  . Cancer Maternal Grandmother     LIVER CA    Review of Systems:  Constitutional  Feels well,  ENT Normal appearing ears and nares bilaterally Skin/Breast  No rash, sores, jaundice, itching, dryness Cardiovascular  No chest pain, shortness of breath, or edema  Pulmonary  No cough or wheeze.  Gastro Intestinal  No nausea, vomitting, or diarrhoea. No bright red blood per rectum, no abdominal pain, change in bowel movement, or constipation.  Genito Urinary  No frequency, urgency, dysuria, see HPI Musculo Skeletal  No myalgia, arthralgia, joint swelling or pain  Neurologic  No weakness, numbness, change in gait,  Psychology  No depression, anxiety, insomnia.   Vitals: Blood pressure 136/81, pulse 105,  temperature 98.1 F (36.7 C), temperature source Oral, resp. rate 20, height 5\' 4"  (1.626 m), weight 186 lb 11.2 oz (84.687 kg), SpO2 100 %.  Physical Exam: WD in NAD Neck  Supple NROM, without any enlargements.  Lymph Node Survey No cervical supraclavicular or inguinal adenopathy Cardiovascular  Pulse normal rate, regularity and rhythm. S1 and S2 normal.  Lungs  Clear to auscultation bilateraly, without wheezes/crackles/rhonchi. Good air movement.  Skin  No rash/lesions/breakdown  Psychiatry  Alert and oriented to person, place, and time  Abdomen  Normoactive bowel sounds, abdomen soft, non-tender and overweight without evidence of hernia.  Back No CVA tenderness Genito Urinary  Vulva/vagina: Normal external female genitalia. No lesions. No discharge or bleeding. Bladder/urethra: No lesions or masses, well supported bladder Vagina: normal Cervix: Normal appearing, no lesions. Uterus: Bulky 14-16 week size uterus, mobile. Adnexa: no palpable masses. Rectal  Good tone, no masses no cul de sac nodularity.  Extremities  No bilateral cyanosis, clubbing or edema.       Donaciano Eva, MD

## 2015-10-02 NOTE — Op Note (Signed)
OPERATIVE NOTE 10/02/15  Surgeon: Donaciano Eva   Assistants: Dr Lahoma Crocker (an MD assistant was necessary for tissue manipulation, management of robotic instrumentation, retraction and positioning due to the complexity of the case and hospital policies).   Anesthesia: General endotracheal anesthesia  ASA Class: 2   Pre-operative Diagnosis: symptomatic uterine fibroids, right adnexal mass, elevated CA 125  Post-operative Diagnosis: right ovarian fibroma vs fibroid, retroperitoneal right fibroid (3cm)  Operation: Robotic-assisted laparoscopic total hysterectomy >250gm with right salpingoophorectomy , left salpingectomy Surgeon: Donaciano Eva  Assistant Surgeon: Lahoma Crocker MD  Anesthesia: GET  Urine Output: 100  Operative Findings:  : 15cm uterus, 3cm retroperitoneal fibroid approximating ureter and uterine artery. Normal left ovary. Degenerating debris from cervix consistent with degenerating fibroid. Omental adhesions to anterior abdominal wall.  Estimated Blood Loss:  100      Total IV Fluids: 1,000 ml         Specimens: washings, uterus, cervix, right tube and ovary, left tube         Complications:  None; patient tolerated the procedure well.         Disposition: PACU - hemodynamically stable.  Procedure Details  The patient was seen in the Holding Room. The risks, benefits, complications, treatment options, and expected outcomes were discussed with the patient.  The patient concurred with the proposed plan, giving informed consent.  The site of surgery properly noted/marked. The patient was identified as Beverly Turner and the procedure verified as a Robotic-assisted hysterectomy with bilateral salpingo oophorectomy. A Time Out was held and the above information confirmed.  After induction of anesthesia, the patient was draped and prepped in the usual sterile manner. Pt was placed in supine position after anesthesia and draped and prepped in  the usual sterile manner. The abdominal drape was placed after the CholoraPrep had been allowed to dry for 3 minutes.  Her arms were tucked to her side with all appropriate precautions.  The shoulders were stabilized with padded shoulder blocks applied to the acromium processes.  The patient was placed in the semi-lithotomy position in [place].  The perineum was prepped with Betadine. The patient was then prepped. Foley catheter was placed.  A sterile speculum was placed in the vagina.  The cervix was grasped with a single-tooth tenaculum and dilated with Kennon Rounds dilators.  The ZUMI uterine manipulator with a medium colpotomizer ring was placed without difficulty.  A pneum occluder balloon was placed over the manipulator.  OG tube placement was confirmed and to suction.   Next, a 5 mm skin incision was made 1 cm below the subcostal margin in the midclavicular line.  The 5 mm Optiview port and scope was used for direct entry.  Opening pressure was under 10 mm CO2.  The abdomen was insufflated and the findings were noted as above.   At this point and all points during the procedure, the patient's intra-abdominal pressure did not exceed 15 mmHg. Next, a 10 mm skin incision was made in the umbilicus and a right and left port was placed about 10 cm lateral to the robot port on the right and left side.  A fourth arm was placed in the left lower quadrant 2 cm above and superior and medial to the anterior superior iliac spine.  All ports were placed under direct visualization.  Using the laparoscopic scissors the omental adhesions to the anterior abdominal wall were taken down with sharp dissection. The omental pedicle was later made hemostatic with the bipolar  sealing device with the robot.   The patient was placed in steep Trendelenburg.  Bowel was folded away into the upper abdomen.  The robot was docked in the normal manner.  The hysterectomy was started after the round ligament on the right side was incised  and the retroperitoneum was entered and the pararectal space was developed.  The ureter was noted to be on the medial leaf of the broad ligament.  Meticulous sharp retroperitoneal dissection was performed to isolate the ureter and perform ureterolysis to the level of the tunnel of the ureter under the uterine artery on the right. This allowed the ureter to be deflected and mobilized off of the retroperitoneal right broad ligament fibroid. The uterine artery on the right was bipolar sealed at its origin from the internal iliac artery with care to deflect the ureter from the uterine artery at this location. The retroperitoneal fibroid was completely skeletonized from its retroperitoneal attachments and left attached to the uterine specimen.The peritoneum above the ureter was incised and stretched and the infundibulopelvic ligament was skeletonized, cauterized and cut.  The posterior peritoneum was taken down to the level of the KOH ring.  The anterior peritoneum was also taken down.  The bladder flap was created to the level of the KOH ring.  The uterine artery on the right side was skeletonized, cauterized and cut in the normal manner.  A similar procedure was performed on the left, however the left ovary was preserved by separating the left tube from the ovary with bipolar sealing and cutting.  The colpotomy was made and the uterus, cervix, right ovaries and tubes were amputated and placed in a 15cm spleen bag. They were delivered into the vagina where for 20 minutes they were morcellated in a contained fashion and then sent for frozen section pathology which was benign.  Pedicles were inspected and excellent hemostasis was achieved.    The colpotomy at the vaginal cuff was closed with Vicryl on a CT1 needle in a running manner.  Irrigation was used and excellent hemostasis was achieved.  At this point in the procedure was completed.  Robotic instruments were removed under direct visulaization.  The robot was  undocked. The 10 mm ports were closed with Vicryl on a UR-5 needle and the fascia was closed with 0 Vicryl on a UR-5 needle.  The skin was closed with 4-0 Vicryl in a subcuticular manner.  Dermabond was applied.  Sponge, lap and needle counts correct x 2.  The patient was taken to the recovery room in stable condition.  The vagina was swabbed with  minimal bleeding noted.   All instrument and needle counts were correct x  3.   The patient was transferred to the recovery room in a stable condition.  Donaciano Eva, MD

## 2015-10-02 NOTE — Anesthesia Procedure Notes (Signed)
Procedure Name: Intubation Date/Time: 10/02/2015 10:38 AM Performed by: West Pugh Pre-anesthesia Checklist: Patient identified, Emergency Drugs available, Suction available, Patient being monitored and Timeout performed Patient Re-evaluated:Patient Re-evaluated prior to inductionOxygen Delivery Method: Circle system utilized Preoxygenation: Pre-oxygenation with 100% oxygen Intubation Type: IV induction Ventilation: Mask ventilation without difficulty Laryngoscope Size: Mac and 4 Grade View: Grade I Tube type: Oral Tube size: 7.0 mm Number of attempts: 1 Airway Equipment and Method: Stylet Placement Confirmation: ETT inserted through vocal cords under direct vision,  positive ETCO2,  CO2 detector and breath sounds checked- equal and bilateral Secured at: 21 cm Tube secured with: Tape Dental Injury: Teeth and Oropharynx as per pre-operative assessment

## 2015-10-02 NOTE — Progress Notes (Signed)
Pt will be in 1533 in 20 minutes,

## 2015-10-03 DIAGNOSIS — D259 Leiomyoma of uterus, unspecified: Secondary | ICD-10-CM | POA: Diagnosis not present

## 2015-10-03 LAB — BASIC METABOLIC PANEL
ANION GAP: 9 (ref 5–15)
BUN: 9 mg/dL (ref 6–20)
CHLORIDE: 105 mmol/L (ref 101–111)
CO2: 25 mmol/L (ref 22–32)
Calcium: 9.5 mg/dL (ref 8.9–10.3)
Creatinine, Ser: 0.79 mg/dL (ref 0.44–1.00)
GFR calc Af Amer: >60 mL/min (ref >60)
GLUCOSE: 118 mg/dL — AB (ref 65–99)
POTASSIUM: 4.1 mmol/L (ref 3.5–5.1)
Sodium: 139 mmol/L (ref 135–145)

## 2015-10-03 LAB — CBC
HCT: 35.8 % — ABNORMAL LOW (ref 36.0–46.0)
HEMOGLOBIN: 10.2 g/dL — AB (ref 12.0–15.0)
MCH: 24.2 pg — AB (ref 26.0–34.0)
MCHC: 28.5 g/dL — ABNORMAL LOW (ref 30.0–36.0)
MCV: 84.8 fL (ref 78.0–100.0)
PLATELETS: 348 10*3/uL (ref 150–400)
RBC: 4.22 MIL/uL (ref 3.87–5.11)
RDW: 15.3 % (ref 11.5–15.5)
WBC: 10.3 10*3/uL (ref 4.0–10.5)

## 2015-10-03 MED ORDER — OXYCODONE-ACETAMINOPHEN 5-325 MG PO TABS: 1.0000 | ORAL_TABLET | ORAL | Status: DC | PRN

## 2015-10-03 NOTE — Anesthesia Postprocedure Evaluation (Signed)
Anesthesia Post Note  Patient: Beverly Turner  Procedure(s) Performed: Procedure(s) (LRB): XI ROBOTIC ASSISTED TOTAL HYSTERECTOMY UTERUS GREATER THAN 250 GRAMS, WITH RIGHT SALPINGO OOPHORECTOMY, LEFT SALPINGECTOMY (N/A)  Patient location during evaluation: PACU Anesthesia Type: General Level of consciousness: sedated and patient cooperative Pain management: pain level controlled Vital Signs Assessment: post-procedure vital signs reviewed and stable Respiratory status: spontaneous breathing Cardiovascular status: stable Anesthetic complications: no    Last Vitals:  Filed Vitals:   10/03/15 0130 10/03/15 0517  BP: 119/65 119/60  Pulse: 77 82  Temp: 37.2 C 36.9 C  Resp: 18 18    Last Pain:  Filed Vitals:   10/03/15 0725  PainSc: [place]

## 2015-10-03 NOTE — Discharge Instructions (Signed)
10/03/2015  Return to work: 4-6 weeks if applicable  Activity: 1. Be up and out of the bed during the day.  Take a nap if needed.  You may walk up steps but be careful and use the hand rail.  Stair climbing will tire you more than you think, you may need to stop part way and rest.   2. No lifting or straining for 6 weeks.  3. No driving for 1 week(s).  Do not drive if you are taking narcotic pain medicine.  4. Shower daily.  Use soap and water on your incision and pat dry; don't rub.  No tub baths until cleared by your surgeon.   5. No sexual activity and nothing in the vagina for 6 weeks.  6. You may experience a small amount of clear drainage from your incisions, which is normal.  If the drainage persists or increases, please call the office.   Diet: 1. Low sodium Heart Healthy Diet is recommended.  2. It is safe to use a laxative, such as Miralax or Colace, if you have difficulty moving your bowels.   Wound Care: 1. Keep clean and dry.  Shower daily.  Reasons to call the Doctor:  Fever - Oral temperature greater than 100.4 degrees Fahrenheit  Foul-smelling vaginal discharge  Difficulty urinating  Nausea and vomiting  Increased pain at the site of the incision that is unrelieved with pain medicine.  Difficulty breathing with or without chest pain  New calf pain especially if only on one side  Sudden, continuing increased vaginal bleeding with or without clots.   Contacts: For questions or concerns you should contact:  Dr. Everitt Amber at [phone]  Joylene John, NP at [phone]  After Hours: call [phone] and have the GYN Oncologist paged/contacted  Acetaminophen; Oxycodone tablets What is this medicine? ACETAMINOPHEN; OXYCODONE (a set a MEE noe fen; ox i KOE done) is a pain reliever. It is used to treat moderate to severe pain. This medicine may be used for other purposes; ask your health care provider or pharmacist if you have questions. What should  I tell my health care provider before I take this medicine? They need to know if you have any of these conditions: -brain tumor -Crohn's disease, inflammatory bowel disease, or ulcerative colitis -drug abuse or addiction -head injury -heart or circulation problems -if you often drink alcohol -kidney disease or problems going to the bathroom -liver disease -lung disease, asthma, or breathing problems -an unusual or allergic reaction to acetaminophen, oxycodone, other opioid analgesics, other medicines, foods, dyes, or preservatives -pregnant or trying to get pregnant -breast-feeding How should I use this medicine? Take this medicine by mouth with a full glass of water. Follow the directions on the prescription label. You can take it with or without food. If it upsets your stomach, take it with food. Take your medicine at regular intervals. Do not take it more often than directed. Talk to your pediatrician regarding the use of this medicine in children. Special care may be needed. Patients over 65 years old may have a stronger reaction and need a smaller dose. Overdosage: If you think you have taken too much of this medicine contact a poison control center or emergency room at once. NOTE: This medicine is only for you. Do not share this medicine with others. What if I miss a dose? If you miss a dose, take it as soon as you can. If it is almost time for your next dose, take only that dose.  Do not take double or extra doses. What may interact with this medicine? -alcohol -antihistamines -barbiturates like amobarbital, butalbital, butabarbital, methohexital, pentobarbital, phenobarbital, thiopental, and secobarbital -benztropine -drugs for bladder problems like solifenacin, trospium, oxybutynin, tolterodine, hyoscyamine, and methscopolamine -drugs for breathing problems like ipratropium and tiotropium -drugs for certain stomach or intestine problems like propantheline, homatropine  methylbromide, glycopyrrolate, atropine, belladonna, and dicyclomine -general anesthetics like etomidate, ketamine, nitrous oxide, propofol, desflurane, enflurane, halothane, isoflurane, and sevoflurane -medicines for depression, anxiety, or psychotic disturbances -medicines for sleep -muscle relaxants -naltrexone -narcotic medicines (opiates) for pain -phenothiazines like perphenazine, thioridazine, chlorpromazine, mesoridazine, fluphenazine, prochlorperazine, promazine, and trifluoperazine -scopolamine -tramadol -trihexyphenidyl This list may not describe all possible interactions. Give your health care provider a list of all the medicines, herbs, non-prescription drugs, or dietary supplements you use. Also tell them if you smoke, drink alcohol, or use illegal drugs. Some items may interact with your medicine. What should I watch for while using this medicine? Tell your doctor or health care professional if your pain does not go away, if it gets worse, or if you have new or a different type of pain. You may develop tolerance to the medicine. Tolerance means that you will need a higher dose of the medication for pain relief. Tolerance is normal and is expected if you take this medicine for a long time. Do not suddenly stop taking your medicine because you may develop a severe reaction. Your body becomes used to the medicine. This does NOT mean you are addicted. Addiction is a behavior related to getting and using a drug for a non-medical reason. If you have pain, you have a medical reason to take pain medicine. Your doctor will tell you how much medicine to take. If your doctor wants you to stop the medicine, the dose will be slowly lowered over time to avoid any side effects. You may get drowsy or dizzy. Do not drive, use machinery, or do anything that needs mental alertness until you know how this medicine affects you. Do not stand or sit up quickly, especially if you are an older patient. This  reduces the risk of dizzy or fainting spells. Alcohol may interfere with the effect of this medicine. Avoid alcoholic drinks. There are different types of narcotic medicines (opiates) for pain. If you take more than one type at the same time, you may have more side effects. Give your health care provider a list of all medicines you use. Your doctor will tell you how much medicine to take. Do not take more medicine than directed. Call emergency for help if you have problems breathing. The medicine will cause constipation. Try to have a bowel movement at least every 2 to 3 days. If you do not have a bowel movement for 3 days, call your doctor or health care professional. Do not take Tylenol (acetaminophen) or medicines that have acetaminophen with this medicine. Too much acetaminophen can be very dangerous. Many nonprescription medicines contain acetaminophen. Always read the labels carefully to avoid taking more acetaminophen. What side effects may I notice from receiving this medicine? Side effects that you should report to your doctor or health care professional as soon as possible: -allergic reactions like skin rash, itching or hives, swelling of the face, lips, or tongue -breathing difficulties, wheezing -confusion -light headedness or fainting spells -severe stomach pain -unusually weak or tired -yellowing of the skin or the whites of the eyes Side effects that usually do not require medical attention (report to your doctor or health  care professional if they continue or are bothersome): -dizziness -drowsiness -nausea -vomiting This list may not describe all possible side effects. Call your doctor for medical advice about side effects. You may report side effects to FDA at 1-800-FDA-1088. Where should I keep my medicine? Keep out of the reach of children. This medicine can be abused. Keep your medicine in a safe place to protect it from theft. Do not share this medicine with anyone. Selling  or giving away this medicine is dangerous and against the law. This medicine may cause accidental overdose and death if it taken by other adults, children, or pets. Mix any unused medicine with a substance like cat litter or coffee grounds. Then throw the medicine away in a sealed container like a sealed bag or a coffee can with a lid. Do not use the medicine after the expiration date. Store at room temperature between 20 and 25 degrees C (68 and 77 degrees F). NOTE: This sheet is a summary. It may not cover all possible information. If you have questions about this medicine, talk to your doctor, pharmacist, or health care provider.    2016, Elsevier/Gold Standard. (2014-06-14 15:18:46)

## 2015-10-03 NOTE — Discharge Summary (Signed)
Physician Discharge Summary  Patient ID: Beverly Turner MRN: [id number] DOB/AGE: [birth year] 48 y.o.  Admit date: 10/02/2015 Discharge date: 10/03/2015  Admission Diagnoses: Ovarian mass, right  Discharge Diagnoses:  Principal Problem:   Ovarian mass, right Active Problems:   Fibroids, intramural   Pelvic mass in female   Discharged Condition:  The patient is in good condition and stable for discharge.    Hospital Course: On 10/02/2015, the patient underwent the following: Procedure(s): XI ROBOTIC ASSISTED TOTAL HYSTERECTOMY UTERUS GREATER THAN 250 GRAMS, WITH RIGHT SALPINGO OOPHORECTOMY, LEFT SALPINGECTOMY.  The postoperative course was uneventful.  She was discharged to home on postoperative day 1 tolerating a regular diet, minimal pain, passing flatus, voiding.  Consults: None  Significant Diagnostic Studies: None  Treatments: surgery: see above  Discharge Exam: Blood pressure 119/60, pulse 82, temperature 98.5 F (36.9 C), temperature source Oral, resp. rate 18, height 5\' 4"  (1.626 m), weight 181 lb (82.101 kg), SpO2 99 %. General appearance: alert, cooperative and no distress Resp: clear to auscultation bilaterally Cardio: regular rate and rhythm, S1, S2 normal, no murmur, click, rub or gallop GI: soft, non-tender; bowel sounds normal; no masses,  no organomegaly Extremities: extremities normal, atraumatic, no cyanosis or edema Incision/Wound: Lap sites to the abdomen without erythema or drainage  Disposition: 01-Home or Self Care  Discharge Instructions    Call MD for:  difficulty breathing, headache or visual disturbances    Complete by:  As directed      Call MD for:  extreme fatigue    Complete by:  As directed      Call MD for:  hives    Complete by:  As directed      Call MD for:  persistant dizziness or light-headedness    Complete by:  As directed      Call MD for:  persistant nausea and vomiting    Complete by:  As directed      Call MD for:  redness,  tenderness, or signs of infection (pain, swelling, redness, odor or green/yellow discharge around incision site)    Complete by:  As directed      Call MD for:  severe uncontrolled pain    Complete by:  As directed      Call MD for:  temperature >100.4    Complete by:  As directed      Diet - low sodium heart healthy    Complete by:  As directed      Driving Restrictions    Complete by:  As directed   No driving for 1 week.  Do not take narcotics and drive.     Increase activity slowly    Complete by:  As directed      Lifting restrictions    Complete by:  As directed   No lifting greater than 10 lbs.     Sexual Activity Restrictions    Complete by:  As directed   No sexual activity, nothing in the vagina, for 6 weeks.            Medication List    TAKE these medications        cetirizine 10 MG tablet  Commonly known as:  ZYRTEC  Take 10 mg by mouth daily as needed for allergies.     ferrous sulfate 325 (65 FE) MG tablet  Take 325 mg by mouth 2 (two) times daily.     ibuprofen 200 MG tablet  Commonly known as:  ADVIL,MOTRIN  Take 600-800 mg  by mouth every 6 (six) hours as needed for moderate pain.     multivitamin capsule  Take 1 capsule by mouth daily.     oxyCODONE-acetaminophen 5-325 MG tablet  Commonly known as:  PERCOCET/ROXICET  Take 1-2 tablets by mouth every 4 (four) hours as needed (moderate to severe pain).     traMADol 50 MG tablet  Commonly known as:  ULTRAM  Take 1 tablet (50 mg total) by mouth every 6 (six) hours as needed.     triamcinolone cream 0.1 %  Commonly known as:  KENALOG  Apply 1 application topically daily as needed (eczema.).     Vitamin C Chew  Chew 1 each by mouth every morning.           Follow-up Information    Follow up with Donaciano Eva, MD On 11/02/2015.   Specialty:  Obstetrics and Gynecology   Why:  at 2 pm for [place] information:   [place] [id number] [phone]        Greater than thirty minutes were spend for face to face discharge instructions and discharge orders/summary in EPIC.   Signed: CROSS, MELISSA DEAL 10/03/2015, 11:39 AM

## 2015-10-04 ENCOUNTER — Telehealth: Payer: Self-pay

## 2015-10-04 NOTE — Telephone Encounter (Signed)
Orders received from [facility] to contact the patient to update with final surgical pathology report collected on 10/02/2015 : "no cancer seen on final pathology report" . Patient contacted and updated with the pathology results, patient states understanding , denies further questions at this time.

## 2015-10-05 ENCOUNTER — Telehealth: Payer: Self-pay

## 2015-10-05 NOTE — Telephone Encounter (Signed)
Orders received from [facility] to contacted the patient to update with report of surgical pathology: "fibroids, no cancer". Patient contacted and updated with pathology report , patient states understanding ,denies further questions at this time , will call with any changes,.

## 2015-10-08 ENCOUNTER — Other Ambulatory Visit: Payer: Self-pay | Admitting: Gynecologic Oncology

## 2015-10-08 DIAGNOSIS — G8918 Other acute postprocedural pain: Secondary | ICD-10-CM

## 2015-10-08 DIAGNOSIS — D251 Intramural leiomyoma of uterus: Secondary | ICD-10-CM

## 2015-10-08 MED ORDER — OXYCODONE HCL 5 MG PO TABS: 5.0000 mg | ORAL_TABLET | ORAL | Status: DC | PRN

## 2015-10-08 NOTE — Progress Notes (Signed)
See RN note.  Patient called stating she needs a refill on pain medication.  Surgery was last week on 3/7.  Refill given.

## 2015-11-02 ENCOUNTER — Ambulatory Visit: Payer: BC Managed Care – PPO | Attending: Gynecologic Oncology | Admitting: Gynecologic Oncology

## 2015-11-02 ENCOUNTER — Encounter: Payer: Self-pay | Admitting: Gynecologic Oncology

## 2015-11-02 VITALS — BP 131/78 | HR 83 | Temp 97.8°F | Resp 18 | Ht 64.0 in | Wt 183.2 lb

## 2015-11-02 DIAGNOSIS — D270 Benign neoplasm of right ovary: Secondary | ICD-10-CM | POA: Diagnosis present

## 2015-11-02 DIAGNOSIS — N83209 Unspecified ovarian cyst, unspecified side: Secondary | ICD-10-CM

## 2015-11-02 DIAGNOSIS — D259 Leiomyoma of uterus, unspecified: Secondary | ICD-10-CM

## 2015-11-02 DIAGNOSIS — Z90710 Acquired absence of both cervix and uterus: Secondary | ICD-10-CM | POA: Diagnosis not present

## 2015-11-02 DIAGNOSIS — N838 Other noninflammatory disorders of ovary, fallopian tube and broad ligament: Secondary | ICD-10-CM

## 2015-11-02 NOTE — Patient Instructions (Signed)
No follow up indicated , please call with any changes , questions or concerns.  Thank you !

## 2015-11-02 NOTE — Progress Notes (Signed)
POSTOPERATIVE FOLLOW-UP   HPI:  Kateleigh Gallia is a 50 y.o. year old G1P1 initially seen in consultation on 07/26/16 referred by Dr Phineas Real for a right adnexal mass.  She then underwent a robotic assisted hysterectomy, RSO and left salpingectomy on [id number] without complications.  Her postoperative course was uncomplicated.  Her final pathology revealed a right ovarian leiomyoma and fibroids in the right broad ligament.  She is seen today for a postoperative check and to discuss her pathology results and ongoing plan.  Since discharge from the hospital, she is feeling very well.  She has improving appetite, normal bowel and bladder function, and pain controlled with minimal PO medication. She has very light vaginal bleeding. She has no other complaints today.    Review of systems: Constitutional:  She has no weight gain or weight loss. She has no fever or chills. Eyes: No blurred vision Ears, Nose, Mouth, Throat: No dizziness, headaches or changes in hearing. No mouth sores. Cardiovascular: No chest pain, palpitations or edema. Respiratory:  No shortness of breath, wheezing or cough Gastrointestinal: She has normal bowel movements without diarrhea or constipation. She denies any nausea or vomiting. She denies blood in her stool or heart burn. Genitourinary:  She denies pelvic pain, pelvic pressure or changes in her urinary function. She has no hematuria, dysuria, or incontinence. She has no irregular vaginal bleeding or vaginal discharge Musculoskeletal: Denies muscle weakness or joint pains.  Skin:  She has no skin changes, rashes or itching Neurological:  Denies dizziness or headaches. No neuropathy, no numbness or tingling. Psychiatric:  She denies depression or anxiety. Hematologic/Lymphatic:   No easy bruising or bleeding   Physical Exam: There were no vitals taken for this visit. General: Well dressed, well nourished in no apparent distress.   HEENT:  Normocephalic and atraumatic, no  lesions.  Extraocular muscles intact. Sclerae anicteric. Pupils equal, round, reactive. No mouth sores or ulcers. Thyroid is normal size, not nodular, midline. Abdomen:  Soft, nontender, nondistended.  No palpable masses.  No hepatosplenomegaly.  No ascites. Normal bowel sounds.  No hernias.  Incisions are well healed Genitourinary: Normal EGBUS  Vaginal cuff intact. Scant old blood but sutures in tact. Fullness beyond cuff. Extremities: No cyanosis, clubbing or edema.  No calf tenderness or erythema. No palpable cords. Psychiatric: Mood and affect are appropriate. Neurological: Awake, alert and oriented x 3. Sensation is intact, no neuropathy.  Musculoskeletal: No pain, normal strength and range of motion.  Assessment:    50 y.o. year old with uterine and right ovarian leiomyoma.   S/p robotic hysterectomy, left salpingectomy and RSO on 10/02/15 .   Plan: 1) Pathology reports reviewed today 2) Treatment counseling - benign fibroids in ovary and uterus (removed). I discussed that she still has a left ovary. I discussed that due to her history of prior normal pap smears she does not require ongoing surveillance, however, I recommend annual well woman checks with Dr Phineas Real. She was given the opportunity to ask questions, which were answered to her satisfaction, and she is agreement with the above mentioned plan of care.  3)  Return to clinic on a prn basis.  Donaciano Eva, MD

## 2016-02-11 ENCOUNTER — Telehealth: Payer: Self-pay | Admitting: *Deleted

## 2016-02-11 NOTE — Telephone Encounter (Signed)
Pt called asking if we had her blood type, I advised pt this is a lab test we typically don't test for, normally basic labs. Pt advised to check with [facility] office as she did her surgery and my have checked blood type test. Pt will do this and call back if this was not done and I will then check with provider to see if he would approve the order.

## 2016-02-14 ENCOUNTER — Telehealth: Payer: Self-pay | Admitting: *Deleted

## 2016-02-14 ENCOUNTER — Other Ambulatory Visit: Payer: BC Managed Care – PPO

## 2016-02-14 DIAGNOSIS — Z0183 Encounter for blood typing: Secondary | ICD-10-CM

## 2016-02-14 NOTE — Telephone Encounter (Signed)
[place] for ABO and Rh

## 2016-02-14 NOTE — Telephone Encounter (Signed)
Pt called and asked if she could come here to have blood drawn to find out her blood type? Please advise

## 2016-02-14 NOTE — Telephone Encounter (Signed)
Order placed, pt coming today at 3:00pm

## 2017-10-15 IMAGING — MR MR PELVIS WO/W CM
5 of 11 series · 19 of 48 positions shown · IV contrast (multihance)
Comparison: None.

CLINICAL DATA: Right ovarian/adnexal mass.  Fibroids.

EXAM:
MRI PELVIS WITHOUT AND WITH CONTRAST
TECHNIQUE: Multiplanar multisequence MR imaging of the pelvis was performed
both before and after administration of intravenous contrast.
CONTRAST:  17mL MULTIHANCE GADOBENATE DIMEGLUMINE 529 MG/ML IV SOLN

[Series 4: T2 · coronal · 6.0mm · 0.78mm/px · 3 of 26 slices shown (1 of 4)]
[im 1/26]
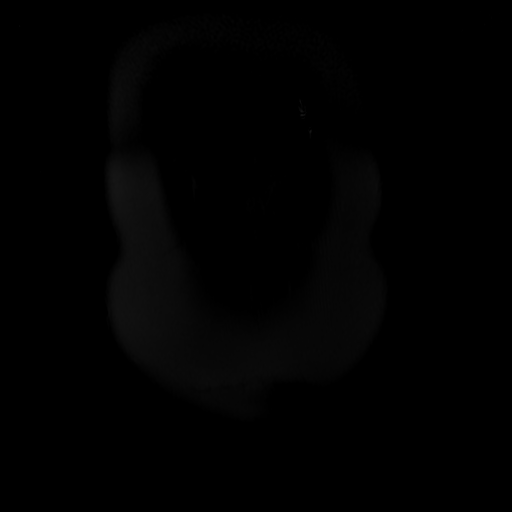
[im 13/26]
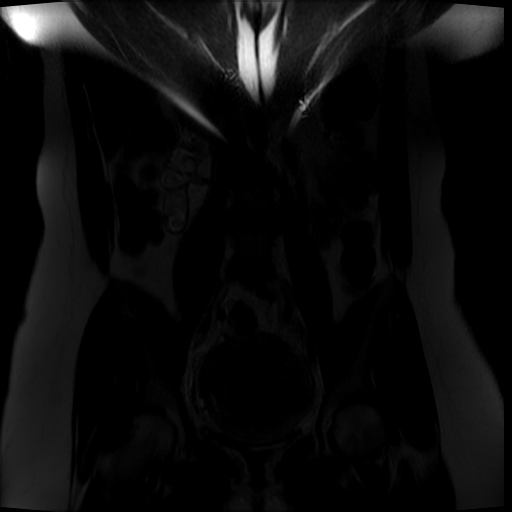
[im 26/26]
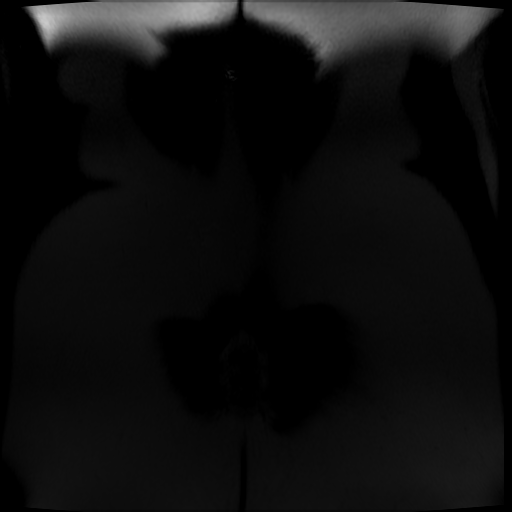

[Series 6: T2 · coronal · 5.0mm · 0.47mm/px · 4 of 32 slices shown (2 of 4)]
[im 1/32]
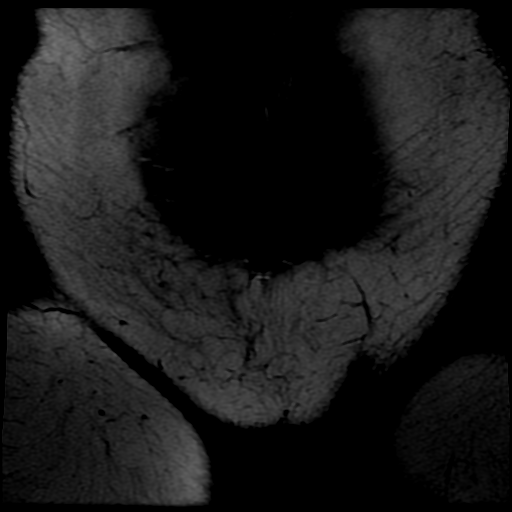
[im 11/32]
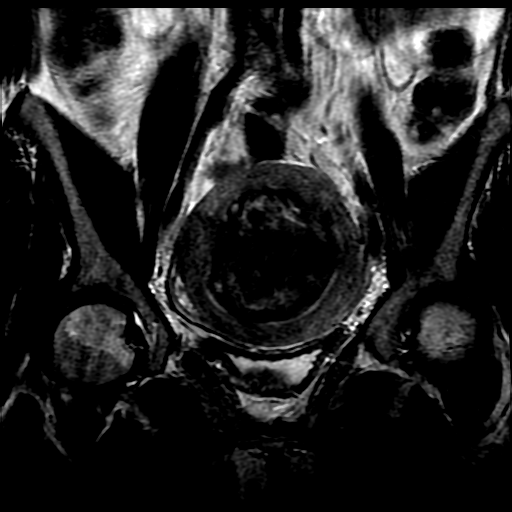
[im 21/32]
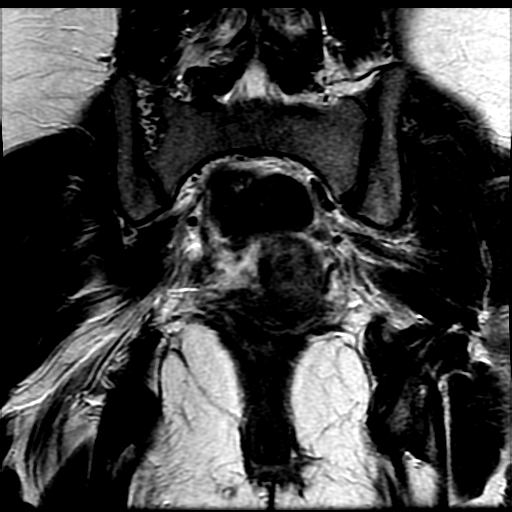
[im 32/32]
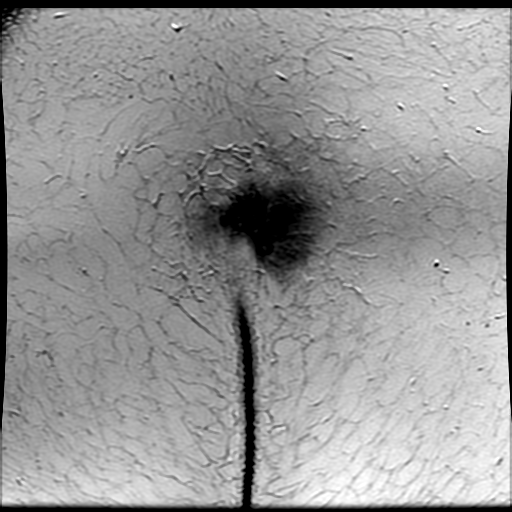

[Series 7: T2 · axial · 5.0mm · 0.47mm/px · z∈[-168,+18]mm · 4 of 32 slices shown (3 of 4)]
[im 1/32]
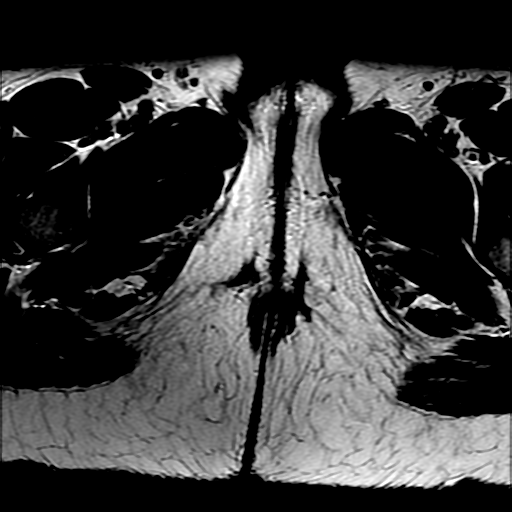
[im 11/32]
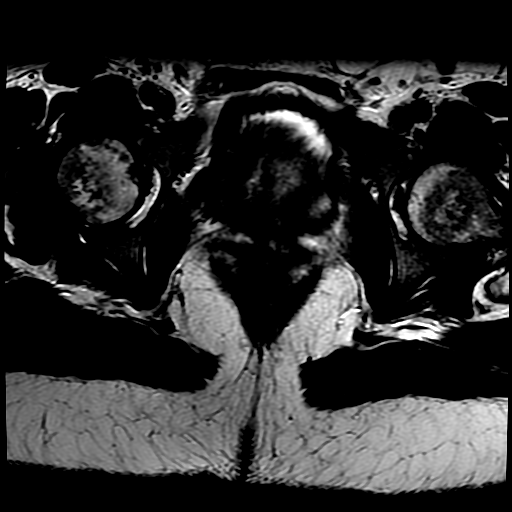
[im 21/32]
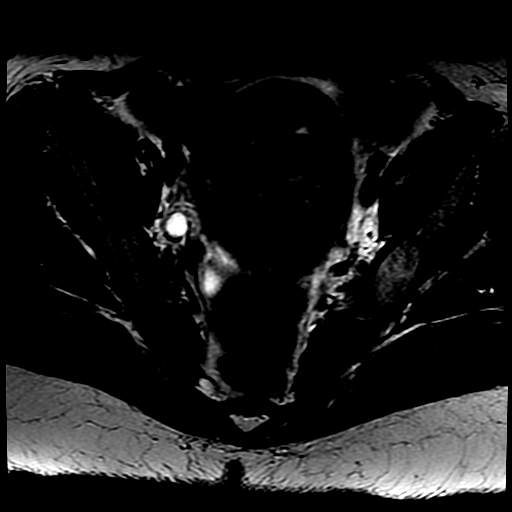
[im 32/32]
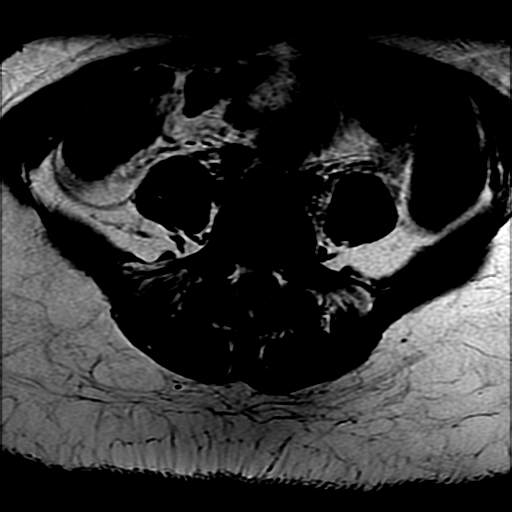

[Series 8: T2 fat-sat · axial · 5.0mm · 0.47mm/px · z∈[-168,+18]mm · 5 of 32 slices shown]
[im 1/32]
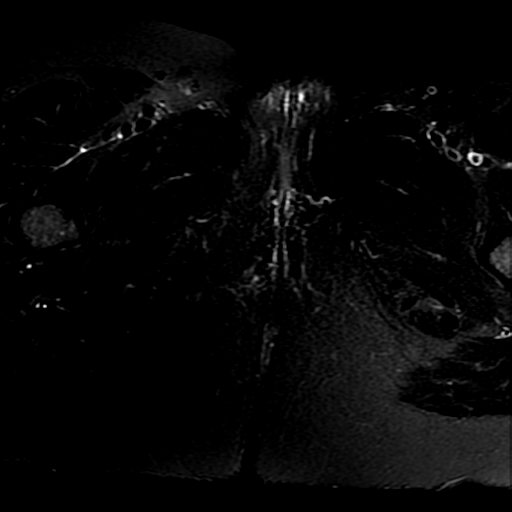
[im 8/32]
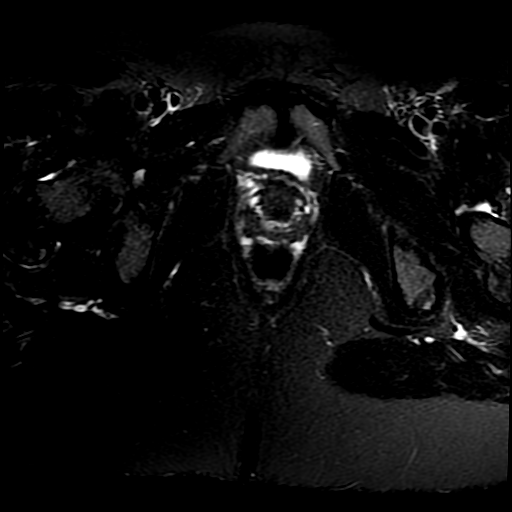
[im 16/32]
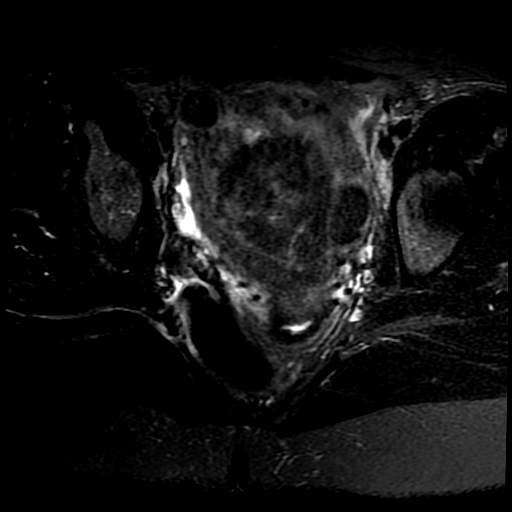
[im 24/32]
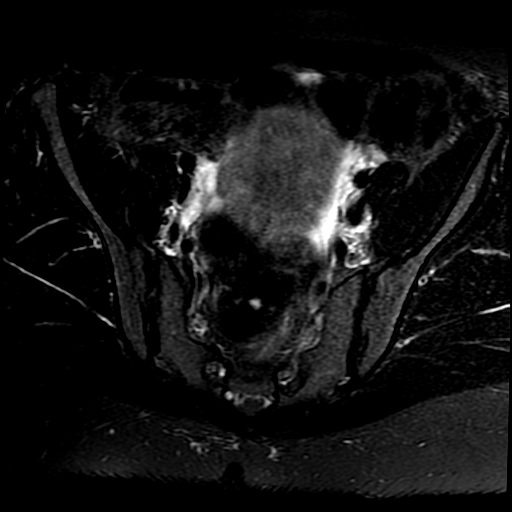
[im 32/32]
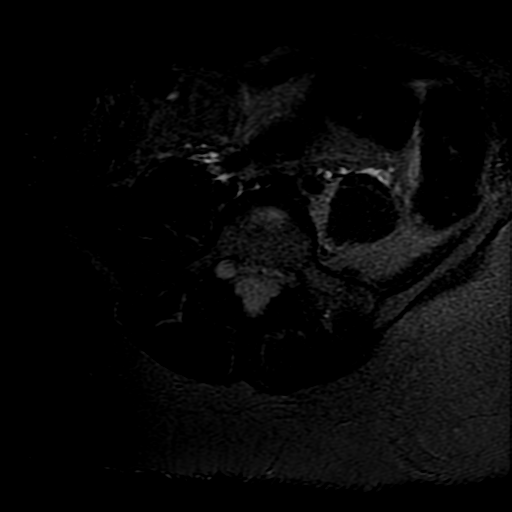

[Series 9: T2 · sagittal · 5.0mm · 0.47mm/px · 3 of 26 slices shown (4 of 4)]
[im 1/26]
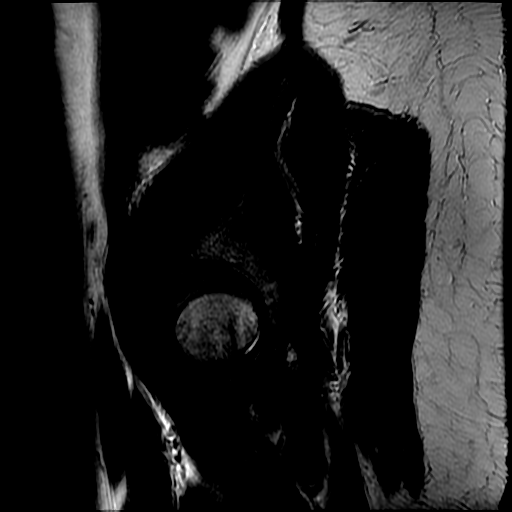
[im 9/26]
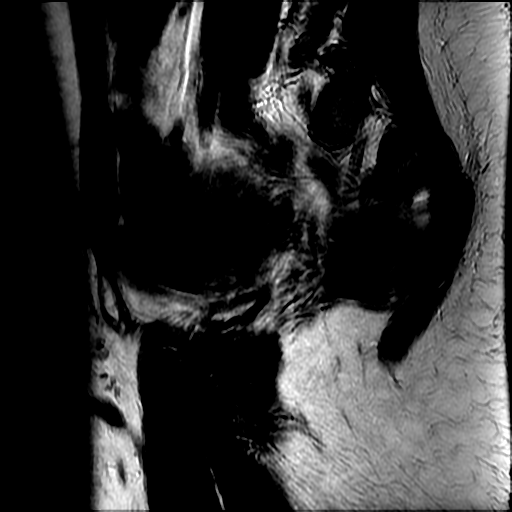
[im 17/26]
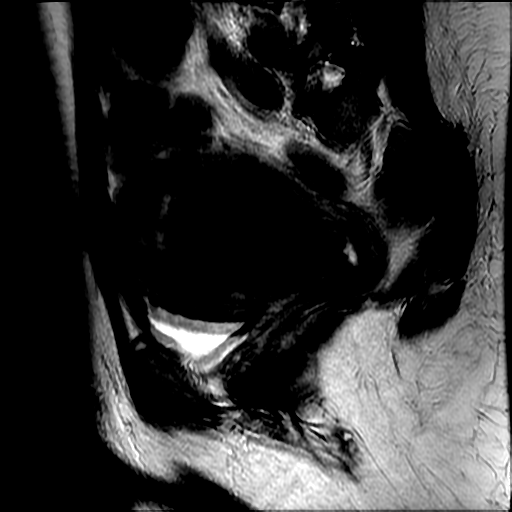

[19 of 48 positions shown; findings below may reference images not displayed]

FINDINGS: Uterus: Measures 13.3 x 8.6 x 8.6 cm. Multiple uterine fibroids are
seen. The largest fibroid is intracavitary in location measuring 6 x
9 cm. Multiple other smaller intramural and subserosal fibroids are
seen measuring up to 3 cm.

Endometrium:  Large intracavitary fibroid, as described above.

Cervix:  Appears normal.

Right ovary: Small right ovarian follicles seen. A solid T2
hypointense mass is also seen in the right adnexa which is
contiguous with the right ovary and uterus, measuring 2.7 x 2.1 x
2.8 cm. Differential diagnosis includes pedunculated fibroid and
ovarian fibrothecoma.

Left ovary:  Appears normal.  No mass identified.

Urinary Bladder:  Unremarkable.

Bowel: Visualized pelvic loops are unremarkable.

Lymph Nodes:  No pathologically enlarged lymph nodes identified.

Other: No abnormal free fluid.
IMPRESSION: Multiple uterine fibroids, with large intracavitary fibroid
measuring approximately 6 x 9 cm.

2.8 cm T2 hypointense mass in the right adnexa which is contiguous
with both the right ovary and uterus. Differential diagnosis
includes pedunculated fibroid and ovarian fibrothecoma.

## 2019-04-20 ENCOUNTER — Encounter: Payer: Self-pay | Admitting: Gynecology

## 2019-10-06 ENCOUNTER — Ambulatory Visit: Payer: BC Managed Care – PPO | Attending: Family

## 2019-10-06 DIAGNOSIS — Z23 Encounter for immunization: Secondary | ICD-10-CM

## 2019-10-06 NOTE — Progress Notes (Signed)
   Covid-19 Vaccination Clinic  Name:  Beverly Turner    MRN: [id number] DOB: [birth year]  10/06/2019  Beverly Turner was observed post Covid-19 immunization for 15 minutes without incident. She was provided with Vaccine Information Sheet and instruction to access the V-Safe system.   Beverly Turner was instructed to call 911 with any severe reactions post vaccine: Marland Kitchen Difficulty breathing  . Swelling of face and throat  . A fast heartbeat  . A bad rash all over body  . Dizziness and weakness   Immunizations Administered    Name Date Dose VIS Date Route   Moderna COVID-19 Vaccine 10/06/2019  9:57 AM 0.5 mL 06/28/2019 Intramuscular   Manufacturer: Moderna   Lot: [id number]   [id number]

## 2019-11-08 ENCOUNTER — Ambulatory Visit: Payer: BC Managed Care – PPO | Attending: Family

## 2019-11-08 DIAGNOSIS — Z23 Encounter for immunization: Secondary | ICD-10-CM

## 2019-11-08 NOTE — Progress Notes (Signed)
   Covid-19 Vaccination Clinic  Name:  Beverly Turner    MRN: [id number] DOB: Dec 26, 1965  11/08/2019  [name] was observed post Covid-19 immunization for 15 minutes without incident. She was provided with Vaccine Information Sheet and instruction to access the V-Safe system.   [name] was instructed to call 911 with any severe reactions post vaccine: Marland Kitchen Difficulty breathing  . Swelling of face and throat  . A fast heartbeat  . A bad rash all over body  . Dizziness and weakness   Immunizations Administered    Name Date Dose VIS Date Route   Moderna COVID-19 Vaccine 11/08/2019 10:16 AM 0.5 mL 06/28/2019 Intramuscular   Manufacturer: Moderna   Lot: [id number]   [place]:[id number]

## 2020-06-14 ENCOUNTER — Ambulatory Visit: Payer: BC Managed Care – PPO | Attending: Family

## 2020-06-14 DIAGNOSIS — Z23 Encounter for immunization: Secondary | ICD-10-CM

## 2020-09-26 NOTE — Progress Notes (Signed)
° °  Covid-19 Vaccination Clinic  Name:  Debralee Braaksma    MRN: [id number] DOB: 06-29-1966  09/26/2020  Ms. Beeghly was observed post Covid-19 immunization for 15 minutes without incident. She was provided with Vaccine Information Sheet and instruction to access the V-Safe system.   Ms. Sparlin was instructed to call 911 with any severe reactions post vaccine:  Difficulty breathing   Swelling of face and throat   A fast heartbeat   A bad rash all over body   Dizziness and weakness   Immunizations Administered    Name Date Dose VIS Date Route   Moderna Covid-19 Booster Vaccine 06/14/2020 11:15 AM 0.25 mL 05/16/2020 Intramuscular   Manufacturer: Moderna   Lot: [id number]   [place]: [phone]
